# Patient Record
Sex: Female | Born: 1960 | Hispanic: No | Marital: Single | Smoking: Former smoker
Health system: Southern US, Community
[De-identification: ages and names within clinical notes are randomized; demographics above are authoritative.]

## PROBLEM LIST (undated history)

## (undated) DIAGNOSIS — Z9289 Personal history of other medical treatment: Secondary | ICD-10-CM

## (undated) DIAGNOSIS — H409 Unspecified glaucoma: Secondary | ICD-10-CM

## (undated) DIAGNOSIS — E119 Type 2 diabetes mellitus without complications: Secondary | ICD-10-CM

## (undated) DIAGNOSIS — I10 Essential (primary) hypertension: Secondary | ICD-10-CM

## (undated) HISTORY — PX: TUBAL LIGATION: SHX77

## (undated) HISTORY — PX: TONSILLECTOMY AND ADENOIDECTOMY: SUR1326

## (undated) HISTORY — DX: Personal history of other medical treatment: Z92.89

## (undated) HISTORY — DX: Type 2 diabetes mellitus without complications: E11.9

## (undated) HISTORY — DX: Essential (primary) hypertension: I10

## (undated) HISTORY — DX: Unspecified glaucoma: H40.9

## (undated) HISTORY — PX: APPENDECTOMY: SHX54

---

## 2012-02-23 DIAGNOSIS — IMO0001 Reserved for inherently not codable concepts without codable children: Secondary | ICD-10-CM | POA: Insufficient documentation

## 2012-02-23 DIAGNOSIS — H401190 Primary open-angle glaucoma, unspecified eye, stage unspecified: Secondary | ICD-10-CM | POA: Insufficient documentation

## 2012-02-23 DIAGNOSIS — H2510 Age-related nuclear cataract, unspecified eye: Secondary | ICD-10-CM | POA: Insufficient documentation

## 2013-03-11 DIAGNOSIS — I10 Essential (primary) hypertension: Secondary | ICD-10-CM | POA: Insufficient documentation

## 2013-03-11 DIAGNOSIS — H409 Unspecified glaucoma: Secondary | ICD-10-CM | POA: Insufficient documentation

## 2013-03-11 DIAGNOSIS — E119 Type 2 diabetes mellitus without complications: Secondary | ICD-10-CM | POA: Insufficient documentation

## 2013-03-11 DIAGNOSIS — N959 Unspecified menopausal and perimenopausal disorder: Secondary | ICD-10-CM | POA: Insufficient documentation

## 2013-10-29 DIAGNOSIS — M542 Cervicalgia: Secondary | ICD-10-CM

## 2013-10-29 DIAGNOSIS — G8929 Other chronic pain: Secondary | ICD-10-CM | POA: Insufficient documentation

## 2014-08-03 DIAGNOSIS — L700 Acne vulgaris: Secondary | ICD-10-CM | POA: Insufficient documentation

## 2015-03-31 LAB — HM MAMMOGRAPHY

## 2015-10-07 DIAGNOSIS — H401131 Primary open-angle glaucoma, bilateral, mild stage: Secondary | ICD-10-CM | POA: Diagnosis not present

## 2015-11-24 DIAGNOSIS — G8929 Other chronic pain: Secondary | ICD-10-CM | POA: Diagnosis not present

## 2015-11-24 DIAGNOSIS — Z794 Long term (current) use of insulin: Secondary | ICD-10-CM | POA: Diagnosis not present

## 2015-11-24 DIAGNOSIS — I10 Essential (primary) hypertension: Secondary | ICD-10-CM | POA: Diagnosis not present

## 2015-11-24 DIAGNOSIS — E119 Type 2 diabetes mellitus without complications: Secondary | ICD-10-CM | POA: Diagnosis not present

## 2015-11-24 DIAGNOSIS — M542 Cervicalgia: Secondary | ICD-10-CM | POA: Diagnosis not present

## 2015-12-13 DIAGNOSIS — M5010 Cervical disc disorder with radiculopathy, unspecified cervical region: Secondary | ICD-10-CM | POA: Diagnosis not present

## 2015-12-13 DIAGNOSIS — M5412 Radiculopathy, cervical region: Secondary | ICD-10-CM | POA: Diagnosis not present

## 2015-12-13 DIAGNOSIS — M4802 Spinal stenosis, cervical region: Secondary | ICD-10-CM | POA: Diagnosis not present

## 2015-12-13 DIAGNOSIS — I10 Essential (primary) hypertension: Secondary | ICD-10-CM | POA: Diagnosis not present

## 2015-12-13 DIAGNOSIS — M5030 Other cervical disc degeneration, unspecified cervical region: Secondary | ICD-10-CM | POA: Diagnosis not present

## 2015-12-13 DIAGNOSIS — M542 Cervicalgia: Secondary | ICD-10-CM | POA: Diagnosis not present

## 2015-12-13 DIAGNOSIS — E119 Type 2 diabetes mellitus without complications: Secondary | ICD-10-CM | POA: Diagnosis not present

## 2015-12-23 DIAGNOSIS — M5412 Radiculopathy, cervical region: Secondary | ICD-10-CM | POA: Diagnosis not present

## 2015-12-23 DIAGNOSIS — M4802 Spinal stenosis, cervical region: Secondary | ICD-10-CM | POA: Diagnosis not present

## 2016-01-17 DIAGNOSIS — M5412 Radiculopathy, cervical region: Secondary | ICD-10-CM | POA: Insufficient documentation

## 2016-01-27 DIAGNOSIS — M5412 Radiculopathy, cervical region: Secondary | ICD-10-CM | POA: Diagnosis not present

## 2016-02-18 DIAGNOSIS — M4802 Spinal stenosis, cervical region: Secondary | ICD-10-CM | POA: Diagnosis not present

## 2016-02-24 DIAGNOSIS — G8929 Other chronic pain: Secondary | ICD-10-CM | POA: Diagnosis not present

## 2016-02-24 DIAGNOSIS — N959 Unspecified menopausal and perimenopausal disorder: Secondary | ICD-10-CM | POA: Diagnosis not present

## 2016-02-24 DIAGNOSIS — M542 Cervicalgia: Secondary | ICD-10-CM | POA: Diagnosis not present

## 2016-02-24 DIAGNOSIS — I10 Essential (primary) hypertension: Secondary | ICD-10-CM | POA: Diagnosis not present

## 2016-02-24 DIAGNOSIS — E119 Type 2 diabetes mellitus without complications: Secondary | ICD-10-CM | POA: Diagnosis not present

## 2016-04-14 DIAGNOSIS — M5412 Radiculopathy, cervical region: Secondary | ICD-10-CM | POA: Insufficient documentation

## 2016-05-03 DIAGNOSIS — M5412 Radiculopathy, cervical region: Secondary | ICD-10-CM | POA: Diagnosis not present

## 2016-05-08 ENCOUNTER — Ambulatory Visit (INDEPENDENT_AMBULATORY_CARE_PROVIDER_SITE_OTHER): Payer: BLUE CROSS/BLUE SHIELD | Admitting: Osteopathic Medicine

## 2016-05-08 ENCOUNTER — Encounter: Payer: Self-pay | Admitting: Osteopathic Medicine

## 2016-05-08 VITALS — BP 150/87 | HR 72 | Ht 66.0 in | Wt 135.0 lb

## 2016-05-08 DIAGNOSIS — M5412 Radiculopathy, cervical region: Secondary | ICD-10-CM

## 2016-05-08 DIAGNOSIS — H409 Unspecified glaucoma: Secondary | ICD-10-CM

## 2016-05-08 DIAGNOSIS — F17200 Nicotine dependence, unspecified, uncomplicated: Secondary | ICD-10-CM

## 2016-05-08 DIAGNOSIS — M4802 Spinal stenosis, cervical region: Secondary | ICD-10-CM

## 2016-05-08 DIAGNOSIS — E1159 Type 2 diabetes mellitus with other circulatory complications: Secondary | ICD-10-CM

## 2016-05-08 DIAGNOSIS — N959 Unspecified menopausal and perimenopausal disorder: Secondary | ICD-10-CM

## 2016-05-08 DIAGNOSIS — Z794 Long term (current) use of insulin: Secondary | ICD-10-CM | POA: Diagnosis not present

## 2016-05-08 LAB — POCT GLYCOSYLATED HEMOGLOBIN (HGB A1C): HEMOGLOBIN A1C: 7.9

## 2016-05-08 MED ORDER — VARENICLINE TARTRATE 1 MG PO TABS: 1.0000 mg | ORAL_TABLET | Freq: Two times a day (BID) | ORAL | 3 refills | Status: DC

## 2016-05-08 NOTE — Progress Notes (Signed)
HPI: Angel [name] is a 55 y.o. female  who presents to [facility] today, 05/08/16,  for chief complaint of:  Chief Complaint  Patient presents with  . Establish Care     CARDIOVASCULAR Losartan-HCTZ HTN - was a little high today, No chest pain, pressure, shortness of breath.  RESPIRATORY Needs refill Chantix   NEUROLOGICAL/musculoskeletal Hx cervical radiculopathy, following with pain management, undergoing epidural steroid injections. Tramadol, tizanidine, gabapentin or prescribed by pain management specialist.  REPRODUCTIVE Medroxyprogesterone for hormone replacement therapy, patient has not been on estrogen, progesterone has helped hot flashes  ENDOCRINE Type 2 diabetes medications include Onglyza, Lantus  Checking Glc in Am, typically in 140-150  Previously on Invokana but cost on this increased so she discontinued Previously Januvia and Levemir.   HEENT On latanoprost and timolol for glaucoma.      Past medical, surgical, social and family history reviewed: Patient Active Problem List   Diagnosis Date Noted  . Cervical radiculitis 04/14/2016  . Cervical radiculopathy 01/17/2016  . Cervical spinal stenosis 12/13/2015  . Chronic neck pain 10/29/2013  . Diabetes mellitus type 2 in nonobese ([place]) 03/11/2013  . Essential hypertension 03/11/2013  . Glaucoma (increased eye pressure) 03/11/2013  . Menopausal and postmenopausal disorder 03/11/2013   History reviewed. No pertinent surgical history. Social History  Substance Use Topics  . Smoking status: Never Smoker  . Smokeless tobacco: Never Used  . Alcohol use Not on file   History reviewed. No pertinent family history.   Current medication list and allergy/intolerance information reviewed:   Current Outpatient Prescriptions  Medication Sig Dispense Refill  . Blood Glucose Monitoring Suppl ([place]) w/Device KIT     . gabapentin (NEURONTIN) 300 MG capsule Take 1  tab qhs for 5 days, increase to BID for 5 days, then increase to TID.    Marland Kitchen Insulin Glargine (LANTUS SOLOSTAR) 100 UNIT/ML Solostar Pen inject 10 units NIGHTLY    . latanoprost (XALATAN) 0.005 % ophthalmic solution     . losartan-hydrochlorothiazide (HYZAAR) 100-12.5 MG tablet take 1 tablet by mouth once daily    . medroxyPROGESTERone (PROVERA) 10 MG tablet take 1 tablet by mouth once daily    . saxagliptin HCl (ONGLYZA) 5 MG TABS tablet take 1 tablet by mouth once daily    . timolol (TIMOPTIC) 0.5 % ophthalmic solution 0.5 mLs.    Marland Kitchen tiZANidine (ZANAFLEX) 4 MG tablet take 1 to 2 tablets by mouth every evening if needed for SPASM    . traMADol (ULTRAM) 50 MG tablet take 1 tablet by mouth twice a day    . aspirin 81 MG chewable tablet Chew 81 mg by mouth.    . medroxyPROGESTERone (PROVERA) 5 MG tablet   0   No current facility-administered medications for this visit.    No Known Allergies    Review of Systems:  Constitutional:  No  fever, no chills, No recent illness, No unintentional weight changes. No significant fatigue.   HEENT: No  headache, no vision change, no hearing change, No sore throat, No  sinus pressure  Cardiac: No  chest pain, No  pressure, No palpitations, No  Orthopnea  Respiratory:  No  shortness of breath. No  Cough  Gastrointestinal: No  abdominal pain, No  nausea, No  vomiting,  No  blood in stool, No  diarrhea, No  constipation   Musculoskeletal: No new myalgia/arthralgia  Genitourinary: No  incontinence, No  abnormal genital bleeding, No abnormal genital discharge  Skin: No  Rash, No other wounds/concerning lesions  Hem/Onc: No  easy bruising/bleeding, No  abnormal lymph node  Endocrine: No cold intolerance,  No heat intolerance. No polyuria/polydipsia/polyphagia   Neurologic: No  weakness, No  dizziness, No  slurred speech/focal weakness/facial droop  Psychiatric: No  concerns with depression, No  concerns with anxiety, No sleep problems, No mood  problems  Exam:  BP (!) 150/87   Pulse 72   Ht 5' 6"  (1.676 m)   Wt 135 lb (61.2 kg)   BMI 21.79 kg/m   Constitutional: VS see above. General Appearance: alert, well-developed, well-nourished, NAD  Eyes: Normal lids and conjunctive, non-icteric sclera  Ears, Nose, Mouth, Throat: MMM, Normal external inspection ears/nares/mouth/lips/gums.   Neck: No masses, trachea midline. No thyroid enlargement. No tenderness/mass appreciated. No lymphadenopathy  Respiratory: Normal respiratory effort. no wheeze, no rhonchi, no rales  Cardiovascular: S1/S2 normal, no murmur, no rub/gallop auscultated. RRR. No lower extremity edema.    Musculoskeletal: Gait normal. No clubbing/cyanosis of digits.   Neurological: Normal balance/coordination. No tremor. .   Skin: warm, dry, intact. No rash/ulcer. No concerning nevi or subq nodules on limited exam.    Psychiatric: Normal judgment/insight. Normal mood and affect. Oriented x3.    Results for orders placed or performed in visit on 05/08/16 (from the past 72 hour(s))  POCT HgB A1C     Status: None   Collection Time: 05/08/16  3:09 PM  Result Value Ref Range   Hemoglobin A1C 7.9     No results found.  Recent labs reviewed: Last A1c was 8.5 on 02/24/2016. Also on the blood draw, no concerns on liver or kidney function, LDL less than 100.  ASSESSMENT/PLAN:   Type 2 diabetes mellitus with other circulatory complication, with long-term current use of insulin ([facility]CC) - Continue current medications, A1c indicates good control. - Plan: POCT HgB A1C  Cervical radiculopathy - Following with pain management, patient has some other musculoskeletal complaints which were not addressed today, sports medicine recommendation given  Cervical spinal stenosis  Glaucoma of both eyes, unspecified glaucoma type - Following with ophthalmology  Menopausal and postmenopausal disorder - Unusual hormone replacement therapy with just progesterone but patient seems to  be tolerating this well. Option to start estrogen, discuss further at next visit  Tobacco dependence - Refilled Chantix, patient has been on this medication before, declines initial starter pack - Plan: varenicline (CHANTIX CONTINUING MONTH PAK) 1 MG tablet    Patient Instructions  Dr. Georgina Snell, Dr. Dianah Field - sports medicine     Visit summary with medication list and pertinent instructions was printed for patient to review. All questions at time of visit were answered - patient instructed to contact office with any additional concerns. ER/RTC precautions were reviewed with the patient. Follow-up plan: Return for ANNUAL PHYSICAL in 4-6 months, sooner if needed.  Note: Total time spent 45 minutes, greater than 50% of the visit was spent face-to-face counseling and coordinating care for the following: The primary encounter diagnosis was Type 2 diabetes mellitus with other circulatory complication, with long-term current use of insulin ([place]). Diagnoses of Cervical radiculopathy, Cervical spinal stenosis, Glaucoma of both eyes, unspecified glaucoma type, Menopausal and postmenopausal disorder, and Tobacco dependence were also pertinent to this visit.Marland Kitchen

## 2016-05-08 NOTE — Patient Instructions (Signed)
Dr. Georgina Snell, Dr. Dianah Field - sports medicine

## 2016-05-23 ENCOUNTER — Ambulatory Visit (INDEPENDENT_AMBULATORY_CARE_PROVIDER_SITE_OTHER): Payer: BLUE CROSS/BLUE SHIELD | Admitting: Sports Medicine

## 2016-05-23 ENCOUNTER — Ambulatory Visit (INDEPENDENT_AMBULATORY_CARE_PROVIDER_SITE_OTHER): Payer: BLUE CROSS/BLUE SHIELD

## 2016-05-23 ENCOUNTER — Encounter: Payer: Self-pay | Admitting: Sports Medicine

## 2016-05-23 DIAGNOSIS — M19012 Primary osteoarthritis, left shoulder: Secondary | ICD-10-CM | POA: Diagnosis not present

## 2016-05-23 DIAGNOSIS — M25512 Pain in left shoulder: Secondary | ICD-10-CM

## 2016-05-23 DIAGNOSIS — G8929 Other chronic pain: Secondary | ICD-10-CM | POA: Diagnosis not present

## 2016-05-23 NOTE — Progress Notes (Addendum)
   Subjective:    I'm seeing this patient as a consultation for:  Dr. Emeterio Reeve  CC: Left shoulder pain  HPI: This is a pleasant 55 year old female, she has a long history of neck pain, with an MRI showing multilevel cervical spinal stenosis, she's done well with regards to her axial neck pain with cervical epidurals. For many months now she's also had pain that she localizes over the posterior glenohumeral joint on the left with radiation over the back of the arm but not past the elbow. Her range of motion is severely limited, and her pain is worsened with abduction and external rotation. She tells me that the arm feels dead in terms of strength but not sensation.  Past medical history:  Negative.  See flowsheet/record as well for more information.  Surgical history: Negative.  See flowsheet/record as well for more information.  Family history: Negative.  See flowsheet/record as well for more information.  Social history: Negative.  See flowsheet/record as well for more information.  Allergies, and medications have been entered into the medical record, reviewed, and no changes needed.   Review of Systems: No headache, visual changes, nausea, vomiting, diarrhea, constipation, dizziness, abdominal pain, skin rash, fevers, chills, night sweats, weight loss, swollen lymph nodes, body aches, joint swelling, muscle aches, chest pain, shortness of breath, mood changes, visual or auditory hallucinations.   Objective:   General: Well Developed, well nourished, and in no acute distress.  Neuro/Psych: Alert and oriented x3, extra-ocular muscles intact, able to move all 4 extremities, sensation grossly intact. Skin: Warm and dry, no rashes noted.  Respiratory: Not using accessory muscles, speaking in full sentences, trachea midline.  Cardiovascular: Pulses palpable, no extremity edema. Abdomen: Does not appear distended. Left Shoulder: Inspection reveals no abnormalities, atrophy or  asymmetry. Palpation is normal with no tenderness over AC joint or bicipital groove. Range of motion is limited in all planes with approximately 20 of abduction, and 40 of external rotation, significantly less compared to the contralateral side. Rotator cuff strength normal throughout. Positive Neer and Hawkin's tests, empty can. Speeds and Yergason's tests normal. No labral pathology noted with negative Obrien's, negative crank, negative clunk, and good stability. Normal scapular function observed. No painful arc and no drop arm sign. No apprehension sign.  Procedure: Real-time Ultrasound Guided Injection of left glenohumeral joint Device: GE Logiq E  Verbal informed consent obtained.  Time-out conducted.  Noted no overlying erythema, induration, or other signs of local infection.  Skin prepped in a sterile fashion.  Local anesthesia: Topical Ethyl chloride.  With sterile technique and under real time ultrasound guidance:  22-gauge spinal needle advanced into the joint from a posterior approach and 1 mL Kenalog 40, 2 mL lidocaine, 2 mL Marcaine injected easily. Completed without difficulty  Pain immediately resolved suggesting accurate placement of the medication.  Advised to call if fevers/chills, erythema, induration, drainage, or persistent bleeding.  Images permanently stored and available for review in the ultrasound unit.  Impression: Technically successful ultrasound guided injection.  Impression and Recommendations:   This case required medical decision making of moderate complexity.  Left shoulder pain Exam is consistent today with adhesive capsulitis. X-rays, glenohumeral injection under ultrasound guidance, formal physical therapy.  Return in 6 weeks, MRI if no better. 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

## 2016-05-23 NOTE — Assessment & Plan Note (Signed)
Exam is consistent today with adhesive capsulitis. X-rays, glenohumeral injection under ultrasound guidance, formal physical therapy.  Return in 6 weeks, MRI if no better. 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

## 2016-06-01 ENCOUNTER — Ambulatory Visit (INDEPENDENT_AMBULATORY_CARE_PROVIDER_SITE_OTHER): Payer: BLUE CROSS/BLUE SHIELD | Admitting: Rehabilitative and Restorative Service Providers"

## 2016-06-01 DIAGNOSIS — R293 Abnormal posture: Secondary | ICD-10-CM | POA: Diagnosis not present

## 2016-06-01 DIAGNOSIS — R29898 Other symptoms and signs involving the musculoskeletal system: Secondary | ICD-10-CM | POA: Diagnosis not present

## 2016-06-01 DIAGNOSIS — M25512 Pain in left shoulder: Secondary | ICD-10-CM

## 2016-06-01 DIAGNOSIS — G8929 Other chronic pain: Secondary | ICD-10-CM | POA: Diagnosis not present

## 2016-06-01 NOTE — Patient Instructions (Addendum)
Shoulder Blade Squeeze    Rotate shoulders back, then squeeze shoulder blades down and back. Hold 10 sec Repeat __10__ times. Do __several __ sessions per day. Can use swim noodle to cue mid back muscles   Scapula Adduction With Pectoralis Stretch: Low - Standing   Shoulders at 45 hands even with shoulders, keeping weight through legs, shift weight forward until you feel pull or stretch through the front of your chest. Hold _30__ seconds. Do _3__ times, _2-4__ times per day.   Scapula Adduction With Pectoralis Stretch: Mid-Range - Standing   Shoulders at 90 elbows even with shoulders, keeping weight through legs, shift weight forward until you feel pull or strength through the front of your chest. Hold __30_ seconds. Do _3__ times, __2-4_ times per day.   Scapula Adduction With Pectoralis Stretch: High - Standing   Shoulders at 120 hands up high on the doorway, keeping weight on feet, shift weight forward until you feel pull or stretch through the front of your chest. Hold _30__ seconds. Do _3__ times, _2-3__ times per day.  Axial Extension (Chin Tuck)    Pull chin in and lengthen back of neck. Hold __10-15__ seconds while counting out loud. Repeat __5__ times. Do _2-3___ sessions per day.   AROM: Lateral Neck Flexion    Slowly tilt head toward one shoulder, then the other. Hold each position __10-15__ seconds. Repeat __3-5__ times per set. Do _2-3___ sessions per day.  TENS UNIT: This is helpful for muscle pain and spasm.   Search and Purchase a TENS 7000 2nd edition at www.tenspros.com. It should be less than $30.     TENS unit instructions: Do not shower or bathe with the unit on Turn the unit off before removing electrodes or batteries If the electrodes lose stickiness add a drop of water to the electrodes after they are disconnected from the unit and place on plastic sheet. If you continued to have difficulty, call the TENS unit company to purchase more  electrodes. Do not apply lotion on the skin area prior to use. Make sure the skin is clean and dry as this will help prolong the life of the electrodes. After use, always check skin for unusual red areas, rash or other skin difficulties. If there are any skin problems, does not apply electrodes to the same area. Never remove the electrodes from the unit by pulling the wires. Do not use the TENS unit or electrodes other than as directed. Do not change electrode placement without consultating your therapist or physician. Keep 2 fingers with between each electrode.

## 2016-06-01 NOTE — Therapy (Signed)
[facility] [place], [place], [address] Phone: [phone]   Fax:  [phone]  Physical Therapy Evaluation  Patient Details  Name: Angel [name] MRN: [id number] Date of Birth: [birth year] Referring Provider: Dr Dianah Field  Encounter Date: 06/01/2016      PT End of Session - 06/01/16 0929    Visit Number 1   Number of Visits 12   Date for PT Re-Evaluation 07/13/16   PT Start Time 0929   PT Stop Time 1028   PT Time Calculation (min) 59 min   Activity Tolerance Patient tolerated treatment well      Past Medical History:  Diagnosis Date  . Diabetes mellitus without complication ([place])   . Glaucoma   . History of bone density study   . Hypertension     Past Surgical History:  Procedure Laterality Date  . APPENDECTOMY    . TONSILLECTOMY AND ADENOIDECTOMY    . TUBAL LIGATION      There were no vitals filed for this visit.       Subjective Assessment - 06/01/16 0935    Subjective Patient reports 5 year history of upper back/neck pain which has improved with 3 spinal epidural injections in upper back and neck. She continued pain in the Lt shoulder which has improved following injection from Dr Dianah Field last week.    Pertinent History AODM; HTM; glucoma   Diagnostic tests xrays   Patient Stated Goals get rid of pain and use arm normally again    Currently in Pain? 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 1   Has the patient had a decrease in activity level because of a fear of falling?  No   Is the patient reluctant to leave their home because of a fear of falling?  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; reaching; pulling; lifting    Leisure household chores; has a Primary school teacher at home - uses stepper      Observation/Other Assessments   Focus on Therapeutic Outcomes (FOTO)  47% limitation      Sensation   Additional Comments WNL's per pt report      Posture/Postural Control   Posture Comments head forward; shoulders rounded  and elevated; scapulae abducted and rotated along the thoracic wall      AROM   Right Shoulder Extension 52 Degrees   Right Shoulder Flexion 146 Degrees   Right Shoulder ABduction 165 Degrees   Right Shoulder Internal Rotation 32 Degrees   Right Shoulder External Rotation 98 Degrees   Left Shoulder Extension 39 Degrees   Left Shoulder Flexion 122 Degrees  discomfort   Left Shoulder ABduction 127 Degrees   Left Shoulder Internal Rotation 27 Degrees  compensatory mvt patterns    Left Shoulder External Rotation 90 Degrees  compensatory movement patterns    Cervical Flexion 50   Cervical Extension 63   Cervical - Right Side Bend 30   Cervical - Left Side Bend 35   Cervical - Right Rotation 62   Cervical - Left Rotation 65     Strength   Overall Strength Comments 5/5 bilat UE's      Palpation   Palpation comment signifacant muscle tightness through  pecs; upper trap; leveator; teres; cervical paraspinals                    [facility] Adult PT Treatment/Exercise - 06/01/16 0001      Therapeutic Activites    Therapeutic Activities --  myofacial bal release work      Neuro Re-ed    Neuro Re-ed Details  postural correction; education      Shoulder Exercises: Standing   Other Standing Exercises scap squeeze with noodle 10 sec x 10 - required tactile and verbal cues    Other Standing Exercises axail extension 10 sec x 5; lateral cervical flexion 10 sec x 2      Shoulder Exercises: Stretch   Other Shoulder Stretches 3 way doorway 30 sec x 3 reps each position    Other Shoulder Stretches supine prolonged snow angel      Moist Heat Therapy   Number Minutes Moist Heat 20 Minutes   Moist Heat Location Cervical;Shoulder  Lt shd/pecs     Electrical Stimulation   Electrical Stimulation Location Lt posterior cervical; Lt Pensions consultant Action IFC   Electrical Stimulation Parameters to toleracne   Electrical Stimulation Goals Pain;Tone                PT Education - 06/01/16 1007    Education provided Yes   Education Details HEP   Person(s) Educated Patient   Methods Explanation;Demonstration;Tactile cues;Verbal cues;Handout   Comprehension Verbalized understanding;Returned demonstration;Verbal cues required;Tactile cues required             PT Long Term Goals - 06/01/16 0930      PT LONG TERM GOAL #1   Title Inmprove posture and alignmnet with patient to demo good posterior shoudler girdle contraction to improve mechanics and function 07/13/16   Time 6   Period Weeks   Status New     PT LONG TERM GOAL #2   Title Increase Lt shoulder ROM to equal or greater than Rt shoulder 07/13/16   Time 6   Period Weeks   Status New     PT LONG TERM GOAL #3   Title Patient reports increased endurance for wark activities with decreased pain by 50-75% 07/13/16   Time 6   Period Weeks   Status New      PT LONG TERM GOAL #4   Title Independent in HEP 07/13/16   Time 6   Period Weeks   Status New     PT  LONG TERM GOAL #5   Title Improve FOTO to </= 34% limitation 07/13/16   Time 6   Period Weeks   Status New               Plan - 06/01/16 1025    Clinical Impression Statement Patient presents with cervical and thoracic pain and dysfunction which is resolving with injections. She has Lt shoudler pain which is also improved with injection Lt GH joint last week. Patient continues to have poor posture and alignment; limited cervical and Lt > Rt shoulder ROM; muscular tightness and tenderness with palpation; limited functional activity level due to myofacial dysfunction.   Rehab Potential Good   PT Frequency 2x / week   PT Duration 6 weeks   PT Treatment/Interventions Patient/family education;ADLs/Self Care Home Management;Cryotherapy;Electrical Stimulation;Iontophoresis 4mg /ml Dexamethasone;Moist Heat;Traction;Ultrasound;Dry needling;Manual techniques;Therapeutic activities;Therapeutic exercise   PT Next Visit Plan focus on Lt shd ROM/mobility inc pulley/joint mobs/PROM; manual work through [employer] upper quarter - significant tightness through [place]; neuromuscular reeducation for posture and alignment; modalities as indicated    Consulted and Agree with Plan of Care Patient      Patient will benefit from skilled therapeutic intervention in order to improve the following deficits and impairments:  Postural dysfunction, Improper body mechanics, Pain, Impaired UE functional use, Increased fascial restricitons, Increased muscle spasms, Decreased range of motion, Decreased mobility, Decreased activity tolerance  Visit Diagnosis: Chronic left shoulder pain - Plan: PT plan of care cert/re-cert  Other symptoms and signs involving the musculoskeletal system - Plan: PT plan of care cert/re-cert  Abnormal posture - Plan: PT plan of care cert/re-cert     Problem List Patient Active  Problem List   Diagnosis Date Noted  . Left shoulder pain 05/23/2016  . Cervical spinal stenosis 12/13/2015  . Diabetes mellitus type 2 in nonobese ([place]) 03/11/2013  . Essential hypertension 03/11/2013  . Glaucoma (increased eye pressure) 03/11/2013  . Menopausal and postmenopausal disorder 03/11/2013    Adrien Dietzman Nilda Simmer PT, MPH  06/01/2016, 10:40 AM  [facility] [place], [place], [address] Phone: [phone]   Fax:  [phone]  Name: Angel [name] MRN: [id number] Date of Birth: 1960/08/01

## 2016-06-02 DIAGNOSIS — M5412 Radiculopathy, cervical region: Secondary | ICD-10-CM | POA: Diagnosis not present

## 2016-06-05 ENCOUNTER — Ambulatory Visit (INDEPENDENT_AMBULATORY_CARE_PROVIDER_SITE_OTHER): Payer: BLUE CROSS/BLUE SHIELD | Admitting: Physical Therapy

## 2016-06-05 ENCOUNTER — Encounter (INDEPENDENT_AMBULATORY_CARE_PROVIDER_SITE_OTHER): Payer: Self-pay

## 2016-06-05 DIAGNOSIS — R293 Abnormal posture: Secondary | ICD-10-CM

## 2016-06-05 DIAGNOSIS — M25512 Pain in left shoulder: Secondary | ICD-10-CM

## 2016-06-05 DIAGNOSIS — G8929 Other chronic pain: Secondary | ICD-10-CM

## 2016-06-05 DIAGNOSIS — R29898 Other symptoms and signs involving the musculoskeletal system: Secondary | ICD-10-CM | POA: Diagnosis not present

## 2016-06-05 NOTE — Therapy (Signed)
[place], [place], [address] Phone: [phone]   Fax:  [phone]  Physical Therapy Treatment  Patient Details  Name: Angel [name] MRN: [id number] Date of Birth: 08/09/1960 Referring Provider: Dr. Helane Rima  Encounter Date: 06/05/2016      PT End of Session - 06/05/16 0717    Visit Number 2   Number of Visits 12   Date for PT Re-Evaluation 07/13/16   PT Start Time 0715   PT Stop Time 0801   PT Time Calculation (min) 46 min   Activity Tolerance Patient tolerated treatment well   Behavior During Therapy [facility] for tasks assessed/performed      Past Medical History:  Diagnosis Date  . Diabetes mellitus without complication ([place])   . Glaucoma   . History of bone density study   . Hypertension     Past Surgical History:  Procedure Laterality Date  . APPENDECTOMY    . TONSILLECTOMY AND ADENOIDECTOMY    . TUBAL LIGATION      There were no vitals filed for this visit.      Subjective Assessment - 06/05/16 0718    Subjective Pt reports she has been doing exercises Dr. Helane Rima issued as well as the ones Gillermo Murdoch, PT gave last session.  "I'm feeling great".   Pt reports she is 90% better than when she visited the MD.    Patient Stated Goals get rid of pain and use arm normally again    Currently in Pain? Yes   Pain Score 1    Pain Location Shoulder   Pain Orientation Left   Pain Descriptors / Indicators Dull;Aching   Aggravating Factors  shaking 30-40# at work   Pain Relieving Factors injection, ice/heat             [facility] PT Assessment - 06/05/16 0001      Assessment   Medical Diagnosis Lt shoulder pain - adhesive capsulitis   Referring Provider Dr. Helane Rima   Onset Date/Surgical Date 01/11/16   Hand Dominance Right   Next MD Visit 2/18   Prior Therapy none for shoulder           [facility] Adult PT Treatment/Exercise - 06/05/16 0001      Shoulder Exercises:  Standing   Internal Rotation AAROM;Left;10 reps  with cane   Flexion AAROM;Left;5 reps  with cane   ABduction AAROM;Left;10 reps  with cane    Extension AAROM;Left;5 reps  with cane    Other Standing Exercises scap squeeze x 10 sec x 5 reps with tactile and visual cues with mirror   Other Standing Exercises axial extension 10 sec x 5- tactile cues and mirror for visual feedback. lateral cervical flexion stretch 10 sec x 3 reps each side.      Shoulder Exercises: Pulleys   Flexion --  10 reps, held 10 sec   ABduction --  10 reps, 10 sec hold     Shoulder Exercises: Stretch   Internal Rotation Stretch --  10 reps, 10 sec (RUE assisting LUE)   Other Shoulder Stretches 3 way doorway 30 sec x  1 rep each position, trying modified position for decreased shoulder and low back discomfort.    Other Shoulder Stretches on 1/2 foam roll:  snow angels to tolerance, x 15 reps      Modalities   Modalities --  pt declined.  will do at work/home.      Manual Therapy   Manual Therapy Soft tissue  mobilization;Myofascial release;Passive ROM   Manual therapy comments pt in supported hooklying    Soft tissue mobilization TPR to Lt infraspinatus/ subscap.    Myofascial Release Lt pec release     Passive ROM Lt shoulder flexion, abd, ER           PT Long Term Goals - 06/05/16 0817      PT LONG TERM GOAL #1   Title Improve posture and alignmnet with patient to demo good posterior shoulder girdle contraction to improve mechanics and function 07/13/16   Time 6   Period Weeks   Status On-going     PT LONG TERM GOAL #2   Title Increase Lt shoulder ROM to equal or greater than Rt shoulder 07/13/16   Time 6   Period Weeks   Status On-going     PT LONG TERM GOAL #3   Title Patient reports increased endurance for work activities with decreased pain by 50-75% 07/13/16   Time 6   Period Weeks   Status On-going     PT LONG TERM GOAL #4   Title Independent in HEP 07/13/16   Period Weeks   Status  On-going     PT LONG TERM GOAL #5   Title Improve FOTO to </= 34% limitation 07/13/16   Time 6   Period Weeks   Status On-going               Plan - 06/05/16 0813    Clinical Impression Statement Pt reporting improved mobility in Lt shoulder since injection.  She required multiple cues for correcting form on existing exercises and to reduce pain when performing them.  She was point tender in Lt rotator cuff and pec with manual therapy.  Pt is progressing towards goals.  Pt voiced interest in reduction of frequency of appts in future.     Rehab Potential Good   PT Frequency 2x / week   PT Duration 6 weeks   PT Treatment/Interventions Patient/family education;ADLs/Self Care Home Management;Cryotherapy;Electrical Stimulation;Iontophoresis 4mg /ml Dexamethasone;Moist Heat;Traction;Ultrasound;Dry needling;Manual techniques;Therapeutic activities;Therapeutic exercise   PT Next Visit Plan Manual therapy through Lt upper quarter. Progress HEP as tolerated.    Consulted and Agree with Plan of Care Patient      Patient will benefit from skilled therapeutic intervention in order to improve the following deficits and impairments:  Postural dysfunction, Improper body mechanics, Pain, Impaired UE functional use, Increased fascial restricitons, Increased muscle spasms, Decreased range of motion, Decreased mobility, Decreased activity tolerance  Visit Diagnosis: Chronic left shoulder pain  Other symptoms and signs involving the musculoskeletal system  Abnormal posture     Problem List Patient Active Problem List   Diagnosis Date Noted  . Left shoulder pain 05/23/2016  . Cervical spinal stenosis 12/13/2015  . Diabetes mellitus type 2 in nonobese ([place]) 03/11/2013  . Essential hypertension 03/11/2013  . Glaucoma (increased eye pressure) 03/11/2013  . Menopausal and postmenopausal disorder 03/11/2013   Kerin Perna, PTA 06/05/16 8:18 AM  [facility] [place], [place], [address] Phone: [phone]   Fax:  [phone]  Name: Angel [name] MRN: [id number] Date of Birth: [birth year]

## 2016-06-07 ENCOUNTER — Encounter: Payer: Self-pay | Admitting: Rehabilitative and Restorative Service Providers"

## 2016-06-07 ENCOUNTER — Ambulatory Visit (INDEPENDENT_AMBULATORY_CARE_PROVIDER_SITE_OTHER): Payer: BLUE CROSS/BLUE SHIELD | Admitting: Rehabilitative and Restorative Service Providers"

## 2016-06-07 DIAGNOSIS — M25512 Pain in left shoulder: Secondary | ICD-10-CM

## 2016-06-07 DIAGNOSIS — R293 Abnormal posture: Secondary | ICD-10-CM

## 2016-06-07 DIAGNOSIS — R29898 Other symptoms and signs involving the musculoskeletal system: Secondary | ICD-10-CM

## 2016-06-07 DIAGNOSIS — G8929 Other chronic pain: Secondary | ICD-10-CM | POA: Diagnosis not present

## 2016-06-07 NOTE — Therapy (Signed)
[place], [place], [address] Phone: [phone]   Fax:  [phone]  Physical Therapy Treatment  Patient Details  Name: Angel [name] MRN: [id number] Date of Birth: 1960/05/31 Referring Provider: Dr. Helane Rima  Encounter Date: 06/07/2016      PT End of Session - 06/07/16 0711    Visit Number 3   Number of Visits 12   Date for PT Re-Evaluation 07/13/16   PT Start Time 0711   PT Stop Time 0800   PT Time Calculation (min) 49 min   Activity Tolerance Patient tolerated treatment well      Past Medical History:  Diagnosis Date  . Diabetes mellitus without complication ([place])   . Glaucoma   . History of bone density study   . Hypertension     Past Surgical History:  Procedure Laterality Date  . APPENDECTOMY    . TONSILLECTOMY AND ADENOIDECTOMY    . TUBAL LIGATION      There were no vitals filed for this visit.      Subjective Assessment - 06/07/16 [id number]    Subjective Patient reports that her shoulder is feeling good. She is working on the exercises at home. Pleased with progress. Feels confident in continuing HEP    Currently in Pain? No/denies                         [facility] Adult PT Treatment/Exercise - 06/07/16 0001      Shoulder Exercises: Supine   Other Supine Exercises snow angel prolonged stretch 2-3 min      Shoulder Exercises: Standing   Extension Strengthening;Both;20 reps   Theraband Level (Shoulder Extension) Level 2 (Red)   Row Strengthening;Both;20 reps   Theraband Level (Shoulder Row) Level 2 (Red)   Retraction Strengthening;Both;20 reps;Theraband   Theraband Level (Shoulder Retraction) Level 2 (Red);Level 1 (Yellow)  10 reps - discomfort Lt arm switched to yellow TB    Other Standing Exercises scap squeeze x 10 sec x 10   Other Standing Exercises axial extension 10 sec x 5     Shoulder Exercises: Therapy Ball   Flexion 5 reps  10 sec hold  stretching into flexion      Shoulder Exercises: ROM/Strengthening   UBE (Upper Arm Bike) L3 x 4 min alt fwd/back      Iontophoresis   Type of Iontophoresis Dexamethasone   Location ant Lt shd   Dose 18m/amp   Time 8 min      Manual Therapy   Manual therapy comments pt in supported hooklying    Soft tissue mobilization Lt pecs/ant shd/teres   Myofascial Release Lt pec   Passive ROM Lt shoulder flexion, abd, ER, IR                 PT Education - 06/07/16 0749    Education provided Yes   Education Details HEP   Person(s) Educated Patient   Methods Explanation;Demonstration;Tactile cues;Verbal cues;Handout   Comprehension Verbalized understanding;Returned demonstration;Verbal cues required;Tactile cues required             PT Long Term Goals - 06/05/16 0817      PT LONG TERM GOAL #1   Title Improve posture and alignmnet with patient to demo good posterior shoulder girdle contraction to improve mechanics and function 07/13/16   Time 6   Period Weeks   Status On-going     PT LONG TERM GOAL #2   Title Increase Lt  shoulder ROM to equal or greater than Rt shoulder 07/13/16   Time 6   Period Weeks   Status On-going     PT LONG TERM GOAL #3   Title Patient reports increased endurance for work activities with decreased pain by 50-75% 07/13/16   Time 6   Period Weeks   Status On-going     PT LONG TERM GOAL #4   Title Independent in HEP 07/13/16   Period Weeks   Status On-going     PT LONG TERM GOAL #5   Title Improve FOTO to </= 34% limitation 07/13/16   Time 6   Period Weeks   Status On-going               Plan - 06/07/16 0803    Clinical Impression Statement Improving mobility and ROM noted with manual work. Continued end range tightness Lt shoulder and tightness through the pecs/shd girdle musculature. Some pain or discomfort with some of exercises - modified to decresae tolerance. Trial of ionto. Contiinued progress.   Rehab Potential Good   PT  Frequency 2x / week   PT Duration 6 weeks   PT Treatment/Interventions Patient/family education;ADLs/Self Care Home Management;Cryotherapy;Electrical Stimulation;Iontophoresis 4mg /ml Dexamethasone;Moist Heat;Traction;Ultrasound;Dry needling;Manual techniques;Therapeutic activities;Therapeutic exercise   PT Next Visit Plan Manual therapy through Lt upper quarter. Progress HEP as tolerated.    Consulted and Agree with Plan of Care Patient      Patient will benefit from skilled therapeutic intervention in order to improve the following deficits and impairments:  Postural dysfunction, Improper body mechanics, Pain, Impaired UE functional use, Increased fascial restricitons, Increased muscle spasms, Decreased range of motion, Decreased mobility, Decreased activity tolerance  Visit Diagnosis: Chronic left shoulder pain  Other symptoms and signs involving the musculoskeletal system  Abnormal posture     Problem List Patient Active Problem List   Diagnosis Date Noted  . Left shoulder pain 05/23/2016  . Cervical spinal stenosis 12/13/2015  . Diabetes mellitus type 2 in nonobese ([place]) 03/11/2013  . Essential hypertension 03/11/2013  . Glaucoma (increased eye pressure) 03/11/2013  . Menopausal and postmenopausal disorder 03/11/2013    Sarvesh Meddaugh Nilda Simmer PT, MPH  06/07/2016, 8:04 AM  [facility] [place], [place], [address] Phone: [phone]   Fax:  [phone]  Name: Angel [name] MRN: [id number] Date of Birth: [birth year]

## 2016-06-07 NOTE — Patient Instructions (Addendum)
Resisted External Rotation: in Neutral - Bilateral   PALMS UP!! Sit or stand, tubing in both hands, elbows at sides, bent to 90, forearms forward. Pinch shoulder blades together and rotate forearms out. Keep elbows at sides. Repeat __10__ times per set. Do _2-3___ sets per session. Do _2-3___ sessions per day.  Low Row: Standing   Face anchor, feet shoulder width apart. Palms up, pull arms back, squeezing shoulder blades together. Repeat 10__ times per set. Do 2-3__ sets per session. Do 2-3__ sessions per week. Anchor Height: Waist   Strengthening: Resisted Extension   Hold tubing in right hand, arm forward. Pull arm back, elbow straight. Repeat _10___ times per set. Do 2-3____ sets per session. Do 2-3____ sessions per day.  SUPINE Tips A    Being in the supine position means to be lying on the back. Lying on the back is the position of least compression on the bones and discs of the spine, and helps to re-align the natural curves of the back. Arms at side working toward 120 degrees. Hold 3-5 min. Can bend elbows for a few seconds to release pull then return to straight arms

## 2016-06-12 ENCOUNTER — Encounter: Payer: Self-pay | Admitting: Physical Therapy

## 2016-06-14 ENCOUNTER — Encounter: Payer: Self-pay | Admitting: Physical Therapy

## 2016-06-19 ENCOUNTER — Encounter: Payer: Self-pay | Admitting: Physical Therapy

## 2016-06-21 ENCOUNTER — Ambulatory Visit (INDEPENDENT_AMBULATORY_CARE_PROVIDER_SITE_OTHER): Payer: BLUE CROSS/BLUE SHIELD | Admitting: Physical Therapy

## 2016-06-21 DIAGNOSIS — M25512 Pain in left shoulder: Secondary | ICD-10-CM

## 2016-06-21 DIAGNOSIS — R293 Abnormal posture: Secondary | ICD-10-CM

## 2016-06-21 DIAGNOSIS — R29898 Other symptoms and signs involving the musculoskeletal system: Secondary | ICD-10-CM | POA: Diagnosis not present

## 2016-06-21 DIAGNOSIS — G8929 Other chronic pain: Secondary | ICD-10-CM

## 2016-06-21 NOTE — Therapy (Signed)
[place], [place], [address] Phone: [phone]   Fax:  [phone]  Physical Therapy Treatment  Patient Details  Name: Angel [name] MRN: [id number] Date of Birth: [birth year] Referring Provider: Dr. Helane Rima  Encounter Date: 06/21/2016      PT End of Session - 06/21/16 0715    Visit Number 4   Number of Visits 12   Date for PT Re-Evaluation 07/13/16   PT Start Time 0715   PT Stop Time 0756   PT Time Calculation (min) 41 min      Past Medical History:  Diagnosis Date  . Diabetes mellitus without complication ([place])   . Glaucoma   . History of bone density study   . Hypertension     Past Surgical History:  Procedure Laterality Date  . APPENDECTOMY    . TONSILLECTOMY AND ADENOIDECTOMY    . TUBAL LIGATION      There were no vitals filed for this visit.      Subjective Assessment - 06/21/16 0720    Subjective Pt reports her neck is stiff.  She has been doing our HEP as well as Dr. Landry Corporal exercises 2x/day.  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  She didn't notice much difference in shoulder after ionto patch.  No skin irritation.     Patient Stated Goals get rid of pain and use arm normally again    Currently in Pain? Yes   Pain Score 4    Pain Location Shoulder   Pain Orientation Left   Pain Descriptors / Indicators Dull;Sore   Aggravating Factors  shaking 40# samples at work   Pain Relieving Factors ice/heat/TENS            [facility] PT Assessment - 06/21/16 0001      AROM   Right Shoulder Extension 51 Degrees   Right Shoulder Flexion 157 Degrees   Right Shoulder ABduction 163 Degrees   Right Shoulder Internal Rotation 50 Degrees   Right Shoulder External Rotation 90 Degrees   Left Shoulder Extension 45 Degrees   Left Shoulder Flexion 142 Degrees   Left Shoulder ABduction 144 Degrees   Left Shoulder Internal Rotation 45 Degrees  supine abdct  60 deg.    Left Shoulder External Rotation 84 Degrees  supine, abdct 90 deg, discomfort at end   Cervical - Right Side Bend 48   Cervical - Left Side Bend 45   Cervical - Right Rotation 62   Cervical - Left Rotation 65          [facility] Adult PT Treatment/Exercise - 06/21/16 0001      Shoulder Exercises: Supine   Other Supine Exercises snow angel prolonged stretch 2-3 min      Shoulder Exercises: Standing   External Rotation Strengthening;Both;10 reps;Theraband   Theraband Level (Shoulder External Rotation) Level 1 (Yellow)   Extension Strengthening;Both;10 reps;Theraband   Theraband Level (Shoulder Extension) Level 2 (Red)   Row Both;10 reps;Theraband   Theraband Level (Shoulder Row) Level 2 (Red)     Shoulder Exercises: ROM/Strengthening   UBE (Upper Arm Bike) L3 x 3 min alt fwd/back      Shoulder Exercises: Stretch   Internal Rotation Stretch 5 reps   Other Shoulder Stretches med and high doorway stretch x 30 sec x 3 reps each position, each arm    Other Shoulder Stretches Thoracic ext with hands supporting head, seated in chair x 2 reps,  shoulder extension with stick x  5 reps (mirror for visual feedback)      Modalities   Modalities --  pt declined further modalities.      Iontophoresis   Type of Iontophoresis Dexamethasone   Location ant Lt shd (pec minor region)   Dose 71m   Time 12 hr patch      Manual Therapy   Soft tissue mobilization Lt pec / ant shoulder           PT Education - 06/21/16 0758    Education provided Yes   Education Details HEP- discussed removing AAROM cane exercises, except in extension.  Pt informed on trigger point dry needling.    Person(s) Educated Patient   Methods Explanation   Comprehension Verbalized understanding             PT Long Term Goals - 06/21/16 0735      PT LONG TERM GOAL #1   Title Improve posture and alignment with patient to demo good posterior shoulder girdle contraction to improve mechanics and  function 07/13/16   Time 6   Period Weeks   Status On-going     PT LONG TERM GOAL #2   Title Increase Lt shoulder ROM to equal or greater than Rt shoulder 07/13/16   Time 6   Period Weeks   Status On-going  improving     PT LONG TERM GOAL #3   Title Patient reports increased endurance for work activities with decreased pain by 50-75% 07/13/16   Period Weeks   Status Achieved     PT LONG TERM GOAL #4   Title Independent in HEP 07/13/16   Time 6   Period Weeks   Status On-going     PT LONG TERM GOAL #5   Title Improve FOTO to </= 34% limitation 07/13/16   Time 6   Period Weeks   Status On-going               Plan - 06/21/16 0800    Clinical Impression Statement Pt demonstrated improved Lt shoulder ROM and cervical lateral flexion.  She reports increased endurance and decreased shoulder pain during work; has met LTG # 3. She continues to have tender trigger point in Lt ant shoulder; applied ionto treatment. Pt interested in dry needling next visit.     Rehab Potential Good   PT Frequency 2x / week   PT Duration 6 weeks   PT Treatment/Interventions Patient/family education;ADLs/Self Care Home Management;Cryotherapy;Electrical Stimulation;Iontophoresis 452mml Dexamethasone;Moist Heat;Traction;Ultrasound;Dry needling;Manual techniques;Therapeutic activities;Therapeutic exercise   PT Next Visit Plan Dry needling.  Progress HEP as tolerated.    Consulted and Agree with Plan of Care Patient      Patient will benefit from skilled therapeutic intervention in order to improve the following deficits and impairments:  Postural dysfunction, Improper body mechanics, Pain, Impaired UE functional use, Increased fascial restricitons, Increased muscle spasms, Decreased range of motion, Decreased mobility, Decreased activity tolerance  Visit Diagnosis: Chronic left shoulder pain  Other symptoms and signs involving the musculoskeletal system  Abnormal posture     Problem  List Patient Active Problem List   Diagnosis Date Noted  . Left shoulder pain 05/23/2016  . Cervical spinal stenosis 12/13/2015  . Diabetes [place] 2 in nonobese ([place]10/14/2014  . Essential hypertension 03/11/2013  . Glaucoma (increased eye pressure) 03/11/2013  . Menopausal and postmenopausal disorder 03/11/2013   JeKerin PernaPTA 06/21/16 8:06 AM  [id number]: [phone]  Fax:  [phone]  Name: Aliesha Dolata MRN: [id number] Date of Birth: 02-22-1961

## 2016-06-30 ENCOUNTER — Ambulatory Visit (INDEPENDENT_AMBULATORY_CARE_PROVIDER_SITE_OTHER): Payer: BLUE CROSS/BLUE SHIELD | Admitting: Rehabilitative and Restorative Service Providers"

## 2016-06-30 ENCOUNTER — Encounter: Payer: Self-pay | Admitting: Rehabilitative and Restorative Service Providers"

## 2016-06-30 DIAGNOSIS — R29898 Other symptoms and signs involving the musculoskeletal system: Secondary | ICD-10-CM

## 2016-06-30 DIAGNOSIS — G8929 Other chronic pain: Secondary | ICD-10-CM | POA: Diagnosis not present

## 2016-06-30 DIAGNOSIS — R293 Abnormal posture: Secondary | ICD-10-CM

## 2016-06-30 DIAGNOSIS — M25512 Pain in left shoulder: Secondary | ICD-10-CM

## 2016-06-30 NOTE — Therapy (Signed)
Angel [name], [place], [address] Phone: [phone]   Fax:  [phone]  Physical Therapy Treatment  Patient Details  Name: Angel [name] MRN: [id number] Date of Birth: 1960/06/19 Referring Provider: Dr Dianah Field   Encounter Date: 06/30/2016      PT End of Session - 06/30/16 1447    Visit Number 5   Number of Visits 12   Date for PT Re-Evaluation 07/13/16   PT Start Time [id number]   PT Stop Time 1540   PT Time Calculation (min) 53 min   Activity Tolerance Patient tolerated treatment well      Past Medical History:  Diagnosis Date  . Diabetes mellitus without complication ([place])   . Glaucoma   . History of bone density study   . Hypertension     Past Surgical History:  Procedure Laterality Date  . APPENDECTOMY    . TONSILLECTOMY AND ADENOIDECTOMY    . TUBAL LIGATION      There were no vitals filed for this visit.      Subjective Assessment - 06/30/16 1452    Subjective Ionto patch really helped the tightness and pain in the front of the Lt shoudler. Now having pain in the mid back area. Maybe because she has been working for 12 days in a row.    Currently in Pain? Yes   Pain Score 5    Pain Location Shoulder   Pain Orientation Left   Pain Descriptors / Indicators Dull;Sore   Pain Type Chronic pain   Pain Onset More than a month ago   Pain Frequency Constant            [facility] PT Assessment - 06/30/16 0001      Assessment   Medical Diagnosis Lt shoulder pain - adhesive capsulitis   Referring Provider Dr Dianah Field    Onset Date/Surgical Date 01/11/16   Hand Dominance Right   Next MD Visit 2/5/118     AROM   Right Shoulder Extension 51 Degrees   Right Shoulder Flexion 157 Degrees   Right Shoulder ABduction 163 Degrees   Right Shoulder Internal Rotation 50 Degrees   Right Shoulder External Rotation 90 Degrees   Left Shoulder Extension 54 Degrees   Left Shoulder Flexion  143 Degrees   Left Shoulder ABduction 148 Degrees   Left Shoulder Internal Rotation 45 Degrees   Left Shoulder External Rotation 90 Degrees   Cervical - Right Side Bend 54   Cervical - Left Side Bend 68   Cervical - Right Rotation 72   Cervical - Left Rotation 74                     [facility] Adult PT Treatment/Exercise - 06/30/16 0001      Shoulder Exercises: Therapy Ball   Flexion 5 reps  stepping under large ball to stretch shoulder flexors 20 sec     Shoulder Exercises: Stretch   Internal Rotation Stretch 5 reps   Other Shoulder Stretches med and high doorway stretch x 30 sec x 3 reps each position, each arm      Iontophoresis   Type of Iontophoresis Dexamethasone   Location ant Lt shd (pec minor region); second patch Lt leveator scap    Dose 120 mAmp   Time 8 hr patch     Manual Therapy   Manual therapy comments pt prone and supine    Soft tissue mobilization bilat thoracic paraspinals; periscapular musculature Lt > Rt; pec  major/minor Lt    Myofascial Release thoracic praspinals; pecs Lt    Scapular Mobilization Lt scapula in multiple planes pt prone    Passive ROM Lt shoulder flexion, abd, ER, IR                      PT Long Term Goals - 06/30/16 1447      PT LONG TERM GOAL #1   Title Improve posture and alignment with patient to demo good posterior shoulder girdle contraction to improve mechanics and function 07/13/16   Time 6   Period Weeks   Status On-going     PT LONG TERM GOAL #2   Title Increase Lt shoulder ROM to equal or greater than Rt shoulder 07/13/16   Time 6   Period Weeks   Status On-going     PT LONG TERM GOAL #3   Title Patient reports increased endurance for work activities with decreased pain by 50-75% 07/13/16   Time 6   Period Weeks   Status Achieved     PT LONG TERM GOAL #4   Title Independent in HEP 07/13/16   Time 6   Period Weeks   Status On-going     PT LONG TERM GOAL #5   Title Improve FOTO to </= 34%  limitation 07/13/16   Time 6   Period Weeks   Status On-going               Plan - 06/30/16 1541    Clinical Impression Statement Improved Lt shoulder and cervical ROM. Continued pain at end ranges Lt shoulder motions. Patient has increase in muscular tightness through Lt shoudler girdle especially through the pecs. 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 Patient is progressing toward goals of therapy but will benefit form continued PT to achieve goals and reach maximum rehab potential.     Rehab Potential Good   PT Frequency 2x / week   PT Duration 6 weeks   PT Treatment/Interventions Patient/family education;ADLs/Self Care Home Management;Cryotherapy;Electrical Stimulation;Iontophoresis 4mg /ml Dexamethasone;Moist Heat;Traction;Ultrasound;Dry needling;Manual techniques;Therapeutic activities;Therapeutic exercise   PT Next Visit Plan Dry needling as indiacted; manual work; joint mobs; PROM; stretching Lt shoudler.  Progress HEP as tolerated.    Consulted and Agree with Plan of Care Patient     Note to MD for appt 07/03/16  Patient will benefit from skilled therapeutic intervention in order to improve the following deficits and impairments:  Postural dysfunction, Improper body mechanics, Pain, Impaired UE functional use, Increased fascial restricitons, Increased muscle spasms, Decreased range of motion, Decreased mobility, Decreased activity tolerance  Visit Diagnosis: Chronic left shoulder pain  Other symptoms and signs involving the musculoskeletal system  Abnormal posture     Problem List Patient Active Problem List   Diagnosis Date Noted  . Left shoulder pain 05/23/2016  . Cervical spinal stenosis 12/13/2015  . Diabetes mellitus type 2 in nonobese ([place]) 03/11/2013  . Essential hypertension 03/11/2013  . Glaucoma (increased eye pressure) 03/11/2013  . Menopausal and postmenopausal disorder 03/11/2013     Junior Huezo Nilda Simmer PT, MPH  06/30/2016, 3:46 PM  [facility] [place], [place], [address] Phone: [phone]   Fax:  [phone]  Name: Angel [name] MRN: [id number] Date of Birth: [birth year]

## 2016-07-03 ENCOUNTER — Ambulatory Visit (INDEPENDENT_AMBULATORY_CARE_PROVIDER_SITE_OTHER): Payer: BLUE CROSS/BLUE SHIELD | Admitting: Sports Medicine

## 2016-07-03 ENCOUNTER — Encounter: Payer: Self-pay | Admitting: Sports Medicine

## 2016-07-03 DIAGNOSIS — M4802 Spinal stenosis, cervical region: Secondary | ICD-10-CM

## 2016-07-03 DIAGNOSIS — M7502 Adhesive capsulitis of left shoulder: Secondary | ICD-10-CM | POA: Diagnosis not present

## 2016-07-03 NOTE — Progress Notes (Signed)
  Subjective:    CC: Follow-up  HPI: Adhesive capsulitis: Left-sided, resolved after glenohumeral joint injection and formal physical therapy.  Cervical degenerative disc disease: Has been seeing a pain provider at [facility], has had 3 epidurals, the most recent of which was 2 months ago. Still has some right-sided radiating pain from the neck to the dorsal hand, as well as periscapular symptoms around the rhomboids. She is only taking 300 mg of gabapentin twice a day, and Zanaflex.  Past medical history:  Negative.  See flowsheet/record as well for more information.  Surgical history: Negative.  See flowsheet/record as well for more information.  Family history: Negative.  See flowsheet/record as well for more information.  Social history: Negative.  See flowsheet/record as well for more information.  Allergies, and medications have been entered into the medical record, reviewed, and no changes needed.   Review of Systems: No fevers, chills, night sweats, weight loss, chest pain, or shortness of breath.   Objective:    General: Well Developed, well nourished, and in no acute distress.  Neuro: Alert and oriented x3, extra-ocular muscles intact, sensation grossly intact.  HEENT: Normocephalic, atraumatic, pupils equal round reactive to light, neck supple, no masses, no lymphadenopathy, thyroid nonpalpable.  Skin: Warm and dry, no rashes. Cardiac: Regular rate and rhythm, no murmurs rubs or gallops, no lower extremity edema.  Respiratory: Clear to auscultation bilaterally. Not using accessory muscles, speaking in full sentences.  Impression and Recommendations:    Adhesive capsulitis of left shoulder Injection and physical therapy has resolved all of her symptoms  Cervical spinal stenosis Doing well for the most part, has been released from her pain doctor, is post 3 cervical epidurals. She is having recurrence of right sided C7 radicular as well as periscapular  pain. Only on 300 mg of gabapentin twice a day, increasing to 600 twice a day. Return in one month to discuss this  I spent 25 minutes with this patient, greater than 50% was face-to-face time counseling regarding the above diagnoses

## 2016-07-03 NOTE — Assessment & Plan Note (Signed)
Doing well for the most part, has been released from her pain doctor, is post 3 cervical epidurals. She is having recurrence of right sided C7 radicular as well as periscapular pain. Only on 300 mg of gabapentin twice a day, increasing to 600 twice a day. Return in one month to discuss this

## 2016-07-03 NOTE — Assessment & Plan Note (Signed)
Injection and physical therapy has resolved all of her symptoms

## 2016-07-05 ENCOUNTER — Ambulatory Visit (INDEPENDENT_AMBULATORY_CARE_PROVIDER_SITE_OTHER): Payer: BLUE CROSS/BLUE SHIELD | Admitting: Physical Therapy

## 2016-07-05 DIAGNOSIS — R293 Abnormal posture: Secondary | ICD-10-CM | POA: Diagnosis not present

## 2016-07-05 DIAGNOSIS — M25512 Pain in left shoulder: Secondary | ICD-10-CM | POA: Diagnosis not present

## 2016-07-05 DIAGNOSIS — R29898 Other symptoms and signs involving the musculoskeletal system: Secondary | ICD-10-CM

## 2016-07-05 DIAGNOSIS — G8929 Other chronic pain: Secondary | ICD-10-CM

## 2016-07-05 NOTE — Therapy (Signed)
[place], [place], [address] Phone: [phone]   Fax:  [phone]  Physical Therapy Treatment  Patient Details  Name: Angel [name] MRN: [id number] Date of Birth: 06/18/1960 Referring Provider: Dr. Dianah Field   Encounter Date: 07/05/2016      PT End of Session - 07/05/16 0803    Visit Number 6   Number of Visits 12   Date for PT Re-Evaluation 07/13/16   PT Start Time 0759   PT Stop Time 0847   PT Time Calculation (min) 48 min      Past Medical History:  Diagnosis Date  . Diabetes mellitus without complication ([place])   . Glaucoma   . History of bone density study   . Hypertension     Past Surgical History:  Procedure Laterality Date  . APPENDECTOMY    . TONSILLECTOMY AND ADENOIDECTOMY    . TUBAL LIGATION      There were no vitals filed for this visit.      Subjective Assessment - 07/05/16 0803    Subjective Pt reports she had  2 hard days at work; her shoulder almost locked up with driving yesterday. She feels 85% improvement since starting therapy.    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 Dianah Field    Onset Date/Surgical Date 01/11/16   Hand Dominance Right   Next MD Visit March 2018     AROM   Left Shoulder Flexion 149 Degrees          [facility] Adult PT Treatment/Exercise - 07/05/16 0001      Shoulder Exercises: Standing   External Rotation Strengthening;Both;10 reps;Theraband  2 sets   Theraband Level (Shoulder External Rotation) Level 2 (Red)   Other Standing Exercises sash with Lt UE x  5 reps (unable to tolerate resistance, limited tolerance for this motion)      Shoulder Exercises: ROM/Strengthening   UBE (Upper Arm Bike) L3 x 4 min alt fwd/back      Shoulder Exercises: Stretch   Internal Rotation Stretch 3 reps   Wall Stretch - Flexion 3 reps;10 seconds   Other Shoulder Stretches med and high doorway stretch x 30 sec x 3 reps each position, each arm      Modalities   Modalities Iontophoresis;Electrical Stimulation;Ultrasound     Theme park manager see [place]   Electrical Stimulation Action COMBO [place]   Electrical Stimulation Parameters to tolerance     Ultrasound   Ultrasound Location Lt thoracic paraspinals/ levator insertion/ rhomboid   Ultrasound Parameters COMBO [place]- 100%, 1.2 w/cm2, 8 min      Iontophoresis   Type of Iontophoresis Dexamethasone   Location ant Lt shd (pec minor region)   Dose 120 mA   Time 8 hr patch     Manual Therapy   Manual therapy comments pt prone and supine    Soft tissue mobilization cross fiber and TPR to Lt  thoracic paraspinals; periscapular musculature Lt;  pec  Myofascial Release thoracic praspinals; pecs Lt    Scapular Mobilization Lt scapula in multiple planes pt supine                      PT Long Term Goals - 07/05/16 1133      PT LONG TERM GOAL #1   Title Improve posture and alignment with patient to demo good posterior shoulder girdle contraction to improve mechanics and function 07/13/16   Time 6   Period Weeks   Status On-going     PT LONG TERM GOAL #2   Title Increase Lt shoulder ROM to equal or greater than Rt shoulder 07/13/16   Time 6   Period Weeks   Status On-going     PT LONG TERM GOAL #3   Title Patient reports increased endurance for work activities with decreased pain by 50-75% 07/13/16   Time 6   Period Weeks   Status Achieved     PT LONG TERM GOAL #4   Title Independent in HEP 07/13/16   Time 6   Period Weeks   Status On-going     PT LONG  TERM GOAL #5   Title Improve FOTO to </= 34% limitation 07/13/16   Time 6   Period Weeks   Status On-going               Plan - 07/05/16 1134    Clinical Impression Statement Pt demonstrated improved Lt shoulder flexion. She continues to have pain and poor scapulohumeral motion when Lt UE over 90 deg of flex/ abdct.  She reported decrease in pain in thoracic paraspinals/post shoulder girdle after combo [place].  Progressing towards goals. Pt voiced interest in dry needling next session and continuation of therapy to reach goals.    Rehab Potential Good   PT Frequency 2x / week   PT Duration 6 weeks   PT Treatment/Interventions Patient/family education;ADLs/Self Care Home Management;Cryotherapy;Electrical Stimulation;Iontophoresis 4mg /ml Dexamethasone;Moist Heat;Traction;Ultrasound;Dry needling;Manual techniques;Therapeutic activities;Therapeutic exercise   PT Next Visit Plan Dry needling as indicated; manual work; joint mobs; PROM; stretching Lt shoudler.  Progress HEP as tolerated.    PT Home Exercise Plan Pt to decrease strengthening to every other day, stretches every day.     Consulted and Agree with Plan of Care Patient      Patient will benefit from skilled therapeutic intervention in order to improve the following deficits and impairments:  Postural dysfunction, Improper body mechanics, Pain, Impaired UE functional use, Increased fascial restricitons, Increased muscle spasms, Decreased range of motion, Decreased mobility, Decreased activity tolerance  Visit Diagnosis: Chronic left shoulder pain  Other symptoms and signs involving the musculoskeletal system  Abnormal posture     Problem List Patient Active Problem List   Diagnosis Date Noted  . Adhesive capsulitis of left shoulder 05/23/2016  . Cervical spinal stenosis 12/13/2015  . Diabetes mellitus type 2 in nonobese ([place]) 03/11/2013  . Essential hypertension 03/11/2013  . Glaucoma (increased eye pressure) 03/11/2013   . Menopausal and postmenopausal disorder 03/11/2013   Kerin Perna, PTA 07/05/16 12:52 PM  [place], [place], [address] Phone: [phone]   Fax:  [phone]  Name: Angel [name] MRN: [id number] Date of Birth: Aug 18, 1960

## 2016-07-07 ENCOUNTER — Telehealth: Payer: Self-pay

## 2016-07-07 ENCOUNTER — Other Ambulatory Visit: Payer: Self-pay | Admitting: Osteopathic Medicine

## 2016-07-07 NOTE — Telephone Encounter (Signed)
Angel [name] called for a refill on Tramadol. She states she sees Dr Dianah Field for neck pain. Please advise.

## 2016-07-07 NOTE — Telephone Encounter (Signed)
This probably needs to be directed back to Dr. Jimmye Norman at [facility] who has been prescribing it, he is her pain doctor.

## 2016-07-10 ENCOUNTER — Telehealth: Payer: Self-pay | Admitting: Emergency Medicine

## 2016-07-10 NOTE — Telephone Encounter (Signed)
Spoke with patient about Tramadol refill request. She no longer goes to Dr. Jimmye Norman and is using Dr. Sheppard Coil as PCP; was told by her to have you handle her pain issues. This is why she wants refill from you.

## 2016-07-10 NOTE — Telephone Encounter (Signed)
I can do it, how often is she taking it?

## 2016-07-10 NOTE — Telephone Encounter (Signed)
As far as my last visit with the patient, she was seeing pain management for cervical spine issues and had some other MSK complaints which I did not have time to address at her visit but thought sports med may be able to help with. I have not spoken with this patient about pain management specifically since she was seeing specialist and as far as I knew she had no intention of not going to them anymore. Let me know if any other questions. I would want to see her back in the office if she is going to request pain meds form [place] long-term.

## 2016-07-12 ENCOUNTER — Telehealth: Payer: Self-pay | Admitting: Sports Medicine

## 2016-07-12 ENCOUNTER — Ambulatory Visit (INDEPENDENT_AMBULATORY_CARE_PROVIDER_SITE_OTHER): Payer: BLUE CROSS/BLUE SHIELD | Admitting: Physical Therapy

## 2016-07-12 DIAGNOSIS — M25512 Pain in left shoulder: Secondary | ICD-10-CM

## 2016-07-12 DIAGNOSIS — G8929 Other chronic pain: Secondary | ICD-10-CM

## 2016-07-12 DIAGNOSIS — R29898 Other symptoms and signs involving the musculoskeletal system: Secondary | ICD-10-CM

## 2016-07-12 DIAGNOSIS — R293 Abnormal posture: Secondary | ICD-10-CM | POA: Diagnosis not present

## 2016-07-12 NOTE — Telephone Encounter (Signed)
Patient stopped by inquiring about the status of this prescription. She stated that she is no longer under the care of Dr. Jimmye Norman and was released from his care in December 2017. I told her she would need to make a follow up appointment to be prescribed long-term pain meds from our office. She said she already has an appointment in April, but I told her she may have to make another appointment before then. Also, she takes 50 mg twice a day.

## 2016-07-12 NOTE — Telephone Encounter (Signed)
Patient stopped in inquiring about a decision

## 2016-07-12 NOTE — Patient Instructions (Signed)

## 2016-07-12 NOTE — Therapy (Signed)
[place], [place], [address] Phone: [phone]   Fax:  [phone]  Physical Therapy Treatment  Patient Details  Name: Angel [name] MRN: [id number] Date of Birth: [birth year] Referring Provider: Dr. Dianah Field   Encounter Date: 07/12/2016      PT End of Session - 07/12/16 1613    Visit Number 7   Number of Visits 12   Date for PT Re-Evaluation 07/13/16   PT Start Time 1518   PT Stop Time 1625   PT Time Calculation (min) 67 min   Activity Tolerance Patient tolerated treatment well      Past Medical History:  Diagnosis Date  . Diabetes mellitus without complication ([place])   . Glaucoma   . History of bone density study   . Hypertension     Past Surgical History:  Procedure Laterality Date  . APPENDECTOMY    . TONSILLECTOMY AND ADENOIDECTOMY    . TUBAL LIGATION      There were no vitals filed for this visit.      Subjective Assessment - 07/12/16 1522    Currently in Pain? Yes   Pain Score 8    Pain Orientation Left                         [facility] Adult PT Treatment/Exercise - 07/12/16 0001      Shoulder Exercises: ROM/Strengthening   UBE (Upper Arm Bike) L3 x 4 min alt fwd/back      Shoulder Exercises: Stretch   Other Shoulder Stretches upper trap stretches   Other Shoulder Stretches mid back stretches     Modalities   Modalities Moist Heat;Electrical Stimulation     Moist Heat Therapy   Number Minutes Moist Heat 20 Minutes   Moist Heat Location Cervical;Shoulder  thoracic     Electrical Stimulation   Electrical Stimulation Location upper traps   Electrical Stimulation Action Premod   Electrical Stimulation Parameters to tolerance   Electrical Stimulation Goals Tone;Pain     Iontophoresis   Type of Iontophoresis Dexamethasone   Location ant Lt shd (pec minor region)   Dose 120 mA   Time 8 hr patch     Manual Therapy   Soft tissue mobilization  bilat upper traps, levators., Lt teres minor.major, Rt rhomboids and paraspinatl thoracis          Trigger Point Dry Needling - 07/12/16 1557    Consent Given? Yes   Education Handout Provided No   Muscles Treated Upper Body Upper trapezius;Levator scapulae;Rhomboids  teres minor & rhomboids Lt others bilat   Upper Trapezius Response Palpable increased muscle length;Twitch reponse elicited   Levator Scapulae Response Palpable increased muscle length;Twitch response elicited   Rhomboids Response Palpable increased muscle length;Twitch response elicited  Rt lower with stim                   PT Long Term Goals - 07/05/16 1133      PT LONG TERM GOAL #1   Title Improve posture and alignment with patient to demo good posterior shoulder girdle contraction to improve mechanics and function 07/13/16   Time 6   Period Weeks   Status On-going     PT LONG TERM GOAL #2   Title Increase Lt shoulder ROM to equal or greater than Rt shoulder 07/13/16   Time 6   Period Weeks   Status On-going     PT LONG TERM GOAL #3  Title Patient reports increased endurance for work activities with decreased pain by 50-75% 07/13/16   Time 6   Period Weeks   Status Achieved     PT LONG TERM GOAL #4   Title Independent in HEP 07/13/16   Time 6   Period Weeks   Status On-going     PT LONG TERM GOAL #5   Title Improve FOTO to </= 34% limitation 07/13/16   Time 6   Period Weeks   Status On-going               Plan - 07/12/16 1613    Clinical Impression Statement Sibbie tolerated SN well, has a lot of twitches in her upper traps and a large one in the Rt thoracic paraspinals and rhomboids.  Increased tissue mobility and less pain after treatmentt.    Rehab Potential Good   PT Frequency 2x / week   PT Duration 6 weeks   PT Treatment/Interventions Patient/family education;ADLs/Self Care Home Management;Cryotherapy;Electrical Stimulation;Iontophoresis 4mg /ml Dexamethasone;Moist  Heat;Traction;Ultrasound;Dry needling;Manual techniques;Therapeutic activities;Therapeutic exercise   PT Home Exercise Plan assess response to DN   Consulted and Agree with Plan of Care Patient      Patient will benefit from skilled therapeutic intervention in order to improve the following deficits and impairments:  Postural dysfunction, Improper body mechanics, Pain, Impaired UE functional use, Increased fascial restricitons, Increased muscle spasms, Decreased range of motion, Decreased mobility, Decreased activity tolerance  Visit Diagnosis: Chronic left shoulder pain  Other symptoms and signs involving the musculoskeletal system  Abnormal posture     Problem List Patient Active Problem List   Diagnosis Date Noted  . Adhesive capsulitis of left shoulder 05/23/2016  . Cervical spinal stenosis 12/13/2015  . Diabetes mellitus type 2 in nonobese ([place]) 03/11/2013  . Essential hypertension 03/11/2013  . Glaucoma (increased eye pressure) 03/11/2013  . Menopausal and postmenopausal disorder 03/11/2013    Jeral Pinch PT 07/12/2016, 4:15 PM  [facility] [place], [place], [address] Phone: [phone]   Fax:  [phone]  Name: Angel [name] MRN: [id number] Date of Birth: [birth year]

## 2016-07-13 MED ORDER — TRAMADOL HCL 50 MG PO TABS: 50.0000 mg | ORAL_TABLET | Freq: Two times a day (BID) | ORAL | 3 refills | Status: DC

## 2016-07-13 NOTE — Telephone Encounter (Signed)
Rx in box for tramadol.  Please let her know we should switch to daily extended release at some point.

## 2016-07-18 ENCOUNTER — Other Ambulatory Visit: Payer: Self-pay | Admitting: Osteopathic Medicine

## 2016-07-20 ENCOUNTER — Ambulatory Visit (INDEPENDENT_AMBULATORY_CARE_PROVIDER_SITE_OTHER): Payer: BLUE CROSS/BLUE SHIELD | Admitting: Physical Therapy

## 2016-07-20 ENCOUNTER — Encounter: Payer: Self-pay | Admitting: Physical Therapy

## 2016-07-20 DIAGNOSIS — R29898 Other symptoms and signs involving the musculoskeletal system: Secondary | ICD-10-CM

## 2016-07-20 DIAGNOSIS — M25512 Pain in left shoulder: Secondary | ICD-10-CM | POA: Diagnosis not present

## 2016-07-20 DIAGNOSIS — R293 Abnormal posture: Secondary | ICD-10-CM | POA: Diagnosis not present

## 2016-07-20 DIAGNOSIS — G8929 Other chronic pain: Secondary | ICD-10-CM | POA: Diagnosis not present

## 2016-07-20 NOTE — Therapy (Signed)
[place], [place], [address] Phone: [phone]   Fax:  [phone]  Physical Therapy Treatment  Patient Details  Name: Angel [name] MRN: [id number] Date of Birth: 12-24-1960 Referring Provider: Dr Dianah Field  Encounter Date: 07/20/2016      PT End of Session - 07/20/16 1621    Visit Number 8   Number of Visits 12   Date for PT Re-Evaluation 08/17/16   PT Start Time 5320   PT Stop Time 1721   PT Time Calculation (min) 60 min   Activity Tolerance Patient tolerated treatment well      Past Medical History:  Diagnosis Date  . Diabetes mellitus without complication ([place])   . Glaucoma   . History of bone density study   . Hypertension     Past Surgical History:  Procedure Laterality Date  . APPENDECTOMY    . TONSILLECTOMY AND ADENOIDECTOMY    . TUBAL LIGATION      There were no vitals filed for this visit.      Subjective Assessment - 07/20/16 1622    Subjective Pt reports that her reach is improved, she has picked up her exercise as she felt like she was loosing strength.  Seh thinks the DN is helping.    Patient Stated Goals get rid of pain and use arm normally again    Currently in Pain? Yes   Pain Score 4    Pain Location Shoulder   Pain Orientation Posterior;Left   Pain Descriptors / Indicators Sore   Pain Type Chronic pain   Pain Onset More than a month ago   Pain Frequency Constant   Aggravating Factors  not sure, its a different more localized pain   Pain Relieving Factors lying on a ball and ice/heat TENs            [facility] PT Assessment - 07/20/16 0001      Assessment   Medical Diagnosis Lt shoulder pain - adhesive capsulitis   Referring Provider Dr Dianah Field   Onset Date/Surgical Date 01/11/16   Hand Dominance Right     Observation/Other Assessments   Focus on Therapeutic Outcomes (FOTO)  41% limited     AROM   Left Shoulder Extension 54 Degrees   slight pain ant shoulder   Left Shoulder Flexion 164 Degrees   Left Shoulder ABduction 142 Degrees  with pain   Left Shoulder Internal Rotation 38 Degrees  standing with pain, 62 after DN   Left Shoulder External Rotation 70 Degrees  standing with pain, 90 after DN                      [facility] Adult PT Treatment/Exercise - 07/20/16 0001      Shoulder Exercises: Stretch   Other Shoulder Stretches doorway mid/high, with strap shoulder flex Lt & chest      Modalities   Modalities Moist Heat;Electrical Stimulation     Moist Heat Therapy   Number Minutes Moist Heat 20 Minutes   Moist Heat Location Shoulder;Cervical  thoracic     Electrical Stimulation   Electrical Stimulation Location Lt posterior shoulder complex   Electrical Stimulation Action IFC   Electrical Stimulation Parameters to tolerance   Electrical Stimulation Goals Tone;Pain     Iontophoresis   Type of Iontophoresis Dexamethasone   Location ant Lt shd (pec minor region)   Dose 170mmp, 1.0cc   Time 8 hr patch     Manual Therapy  Soft tissue mobilization Lt upper traps/levators/teres minor/major all Lt STW           Trigger Point Dry Needling - 07/20/16 1631    Consent Given? Yes   Education Handout Provided No   Muscles Treated Upper Body Levator scapulae  teres minor/major Lt side .   Levator Scapulae Response Palpable increased muscle length;Twitch response elicited                   PT Long Term Goals - 07/20/16 1624      PT LONG TERM GOAL #1   Title Improve posture and alignment with patient to demo good posterior shoulder girdle contraction to improve mechanics and function 07/13/16   Status Achieved     PT LONG TERM GOAL #2   Title Increase Lt shoulder ROM to equal or greater than Rt shoulder 08/17/16   Status Partially Met  lacking in abduction, IR, slightly in ER      PT LONG TERM GOAL #3   Title Patient reports increased endurance for work activities with decreased  pain by 50-75% 07/13/16   Status Achieved     PT LONG TERM GOAL #4   Title Independent in HEP 08/17/16   Time 4   Period Weeks   Status On-going     PT LONG TERM GOAL #5   Title Improve FOTO to </= 34% limitation 08/17/16   Time 4   Status On-going  scored 41% limited today.                Plan - 07/20/16 1703    Clinical Impression Statement Angel [name] is making progress, she had increased Lt shoulder ROM after needling today.  Her pain is slowly decreasing and becoming more localized.  She would benefit from some more treatments to continue with needling and improving functional use of her arm.    Rehab Potential Good   PT Frequency 1x / week   PT Duration 4 weeks   PT Treatment/Interventions Patient/family education;ADLs/Self Care Home Management;Cryotherapy;Electrical Stimulation;Iontophoresis 79m/ml Dexamethasone;Moist Heat;Traction;Ultrasound;Dry needling;Manual techniques;Therapeutic activities;Therapeutic exercise   PT Next Visit Plan Dry needling as indicated; manual work; joint mobs; PROM; stretching Lt shoudler.  Progress HEP as tolerated.    Consulted and Agree with Plan of Care Patient      Patient will benefit from skilled therapeutic intervention in order to improve the following deficits and impairments:  Postural dysfunction, Improper body mechanics, Pain, Impaired UE functional use, Increased fascial restricitons, Increased muscle spasms, Decreased range of motion, Decreased mobility, Decreased activity tolerance  Visit Diagnosis: Chronic left shoulder pain - Plan: PT plan of care cert/re-cert  Other symptoms and signs involving the musculoskeletal system - Plan: PT plan of care cert/re-cert  Abnormal posture - Plan: PT plan of care cert/re-cert     Problem List Patient Active Problem List   Diagnosis Date Noted  . Adhesive capsulitis of left shoulder 05/23/2016  . Cervical spinal stenosis 12/13/2015  . Diabetes mellitus type 2 in nonobese ([place]  03/11/2013  . Essential hypertension 03/11/2013  . Glaucoma (increased eye pressure) 03/11/2013  . Menopausal and postmenopausal disorder 03/11/2013    SJeral PinchPT  07/20/2016, 5:07 PM  [facility] [address]Phone: [phone]  Fax:  [phone] Name: Angel ZillmerMRN: [id number] of Birth: 3August 20, 1962

## 2016-07-23 NOTE — Telephone Encounter (Signed)
See other telephone message 

## 2016-07-26 ENCOUNTER — Ambulatory Visit (INDEPENDENT_AMBULATORY_CARE_PROVIDER_SITE_OTHER): Payer: BLUE CROSS/BLUE SHIELD | Admitting: Physical Therapy

## 2016-07-26 DIAGNOSIS — G8929 Other chronic pain: Secondary | ICD-10-CM

## 2016-07-26 DIAGNOSIS — M25512 Pain in left shoulder: Secondary | ICD-10-CM

## 2016-07-26 DIAGNOSIS — R29898 Other symptoms and signs involving the musculoskeletal system: Secondary | ICD-10-CM

## 2016-07-26 DIAGNOSIS — R293 Abnormal posture: Secondary | ICD-10-CM | POA: Diagnosis not present

## 2016-07-26 NOTE — Therapy (Addendum)
[place], [place], [address] Phone: [phone]   Fax:  [phone]  Physical Therapy Treatment  Patient Details  Name: Maghen Group MRN: [id number] Date of Birth: 1960/12/30 Referring Provider: Dr. Dianah Field.   Encounter Date: 07/26/2016      PT End of Session - 07/26/16 1517    Visit Number 9   Number of Visits 12   Date for PT Re-Evaluation 08/17/16   PT Start Time 1518   PT Stop Time 1603   PT Time Calculation (min) 45 min   Activity Tolerance Patient limited by pain   Behavior During Therapy [facility] for tasks assessed/performed      Past Medical History:  Diagnosis Date  . Diabetes mellitus without complication ([place])   . Glaucoma   . History of bone density study   . Hypertension     Past Surgical History:  Procedure Laterality Date  . APPENDECTOMY    . TONSILLECTOMY AND ADENOIDECTOMY    . TUBAL LIGATION      There were no vitals filed for this visit.      Subjective Assessment - 07/26/16 1522    Subjective Pt reports she had some relief from DN, but not as much as first experience.  She is just finished work and has increased pain.  She states she has returned to wearing a supportive shoulder strap last two days, but she feels it is not helping much.    Currently in Pain? Yes   Pain Score 8    Pain Location Shoulder   Pain Orientation Left;Posterior   Pain Descriptors / Indicators Sore;Constant   Aggravating Factors  work   Pain Relieving Factors ice/heat, TENS            [facility] PT Assessment - 07/26/16 0001      Assessment   Medical Diagnosis Lt shoulder pain - adhesive capsulitis   Referring Provider Dr. Dianah Field.    Onset Date/Surgical Date 01/11/16   Hand Dominance Right   Next MD Visit 07/31/16     AROM   Left Shoulder Extension 55 Degrees  no pain   Left Shoulder Flexion 157 Degrees  in supine; 147 deg standing with pain    Left Shoulder ABduction  127 Degrees  with pain standing    Left Shoulder External Rotation 87 Degrees  with pain, supine abdct 90 deg.          [facility] Adult PT Treatment/Exercise - 07/26/16 0001      Shoulder Exercises: ROM/Strengthening   UBE (Upper Arm Bike) L1: 2 min each direction.      Shoulder Exercises: Stretch   Cross Chest Stretch 5 reps;10 seconds   Wall Stretch - Flexion 2 reps;20 seconds   Other Shoulder Stretches doorway mid/high, with strap shoulder flex Lt & chest      Modalities   Modalities --  pt declined.      Manual Therapy   Manual Therapy Taping   Manual therapy comments pt prone and supine  some guarding in supine, switched to prone.    Soft tissue mobilization cross fiber friction to thoracic paraspinals; TPR to levator and rhomboid.     Myofascial Release to Lt thoracic paraspinals   Coker;Facilitate Muscle     Kinesiotix   Create Space I strips of Rock tape placed over bilat upper thoracic paraspinals and Rt levator/upper trap to decompress tissue and decrease pain.    Facilitate Muscle  I strips of Rock tape  placed over Lt ant/post deltoid.                       PT Long Term Goals - 07/26/16 1654      PT LONG TERM GOAL #1   Title Improve posture and alignment with patient to demo good posterior shoulder girdle contraction to improve mechanics and function 07/13/16   Time 6   Period Weeks   Status Achieved     PT LONG TERM GOAL #2   Title Increase Lt shoulder ROM to equal or greater than Rt shoulder 08/17/16   Time 6   Period Weeks   Status Partially Met     PT LONG TERM GOAL #3   Title Patient reports increased endurance for work activities with decreased pain by 50-75% 07/13/16   Time 6   Period Weeks   Status On-going  had achieved, but pain has returned.      PT LONG TERM GOAL #4   Title Independent in HEP 08/17/16   Time 4   Period Weeks   Status On-going     PT LONG TERM GOAL #5   Title Improve FOTO to </= 34% limitation  08/17/16   Time 4   Period Weeks   Status On-going               Plan - 07/26/16 1655    Clinical Impression Statement Pt demonstrated decreased Lt shoulder ROM this visit compared to last visit, but she was also reporting increased Lt shoulder pain and fatigue upon arrival.  She has had some relief with dry needling, and has had some improvement overall with functional use.  She had difficulty tolerating exercise due to increased pain level.  Pt declined modalities at end of session; states she will use once she is home.   Pt making gradual gains towards remaining goals.    Rehab Potential Good   PT Frequency 1x / week   PT Duration 4 weeks   PT Treatment/Interventions Patient/family education;ADLs/Self Care Home Management;Cryotherapy;Electrical Stimulation;Iontophoresis 39m/ml Dexamethasone;Moist Heat;Traction;Ultrasound;Dry needling;Manual techniques;Therapeutic activities;Therapeutic exercise   PT Next Visit Plan Dry needling as indicated; manual work; joint mobs; PROM; stretching Lt shoudler.  Progress HEP as tolerated.    Consulted and Agree with Plan of Care Patient      Patient will benefit from skilled therapeutic intervention in order to improve the following deficits and impairments:  Postural dysfunction, Improper body mechanics, Pain, Impaired UE functional use, Increased fascial restricitons, Increased muscle spasms, Decreased range of motion, Decreased mobility, Decreased activity tolerance  Visit Diagnosis: Chronic left shoulder pain  Other symptoms and signs involving the musculoskeletal system  Abnormal posture     Problem List Patient Active Problem List   Diagnosis Date Noted  . Adhesive capsulitis of left shoulder 05/23/2016  . Cervical spinal stenosis 12/13/2015  . 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 Essential hypertension 03/11/2013  . Glaucoma (increased eye pressure) 03/11/2013  . Menopausal and postmenopausal disorder  03/11/2013   JKerin Perna PTA 07/26/16 5:03 PM  [facility] [address] [address] [address]Phone: [phone]  Fax:  [phone] Name: NHye TrawickMRN: [id number] of Birth: 3December 01, 1962 PHYSICAL THERAPY DISCHARGE SUMMARY  Visits from Start of Care: 9  Current functional level related to goals / functional outcomes: See progress note for discharge status. Patient has been referred to specialist for further evaluation.   Remaining deficits: Unknown  Education / Equipment: HEP  Plan: Patient agrees to discharge.  Patient goals were partially met. Patient is being discharged due to                                                     ?????   Referred to specialist for second opinion.  Celyn P. Helene Kelp PT, MPH 08/30/16 12:22 PM

## 2016-07-31 ENCOUNTER — Ambulatory Visit (INDEPENDENT_AMBULATORY_CARE_PROVIDER_SITE_OTHER): Payer: BLUE CROSS/BLUE SHIELD | Admitting: Sports Medicine

## 2016-07-31 DIAGNOSIS — M4802 Spinal stenosis, cervical region: Secondary | ICD-10-CM

## 2016-07-31 MED ORDER — GABAPENTIN 600 MG PO TABS: ORAL_TABLET | ORAL | 3 refills | Status: DC

## 2016-07-31 NOTE — Progress Notes (Signed)
  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 MRI from 2014 showed C5-6 and C6-7 degenerative disc disease with bilateral foraminal stenosis at these 2 levels. She has had 3 epidurals the most recent of which was in December without much improvement. We increased her gabapentin last time to 600 mg twice a day, she hasn't noticed any improvement with this either.  Past medical history:  Negative.  See flowsheet/record as well for more information.  Surgical history: Negative.  See flowsheet/record as well for more information.  Family history: Negative.  See flowsheet/record as well for more information.  Social history: Negative.  See flowsheet/record as well for more information.  Allergies, and medications have been entered into the medical record, reviewed, and no changes needed.   Review of Systems: No fevers, chills, night sweats, weight loss, chest pain, or shortness of breath.   Objective:    General: Well Developed, well nourished, and in no acute distress.  Neuro: Alert and oriented x3, extra-ocular muscles intact, sensation grossly intact.  HEENT: Normocephalic, atraumatic, pupils equal round reactive to light, neck supple, no masses, no lymphadenopathy, thyroid nonpalpable.  Skin: Warm and dry, no rashes. Cardiac: Regular rate and rhythm, no murmurs rubs or gallops, no lower extremity edema.  Respiratory: Clear to auscultation bilaterally. Not using accessory muscles, speaking in full sentences.  Impression and Recommendations:    Cervical spinal stenosis Has had 3 epidurals, the most recent of which was 3 months ago, C6-C7 and C5-C6 degenerative changes with periscapular radicular pain as well as radicular pain down to the hand. Increasing gabapentin to 1200 mg twice a day, I'm going to get a new MRI and I would like her to touch base with one of our  neurosurgeons. I think we have maximized nonoperative treatment at this point.  I spent 25 minutes with this patient, greater than 50% was face-to-face time counseling regarding the above diagnoses

## 2016-07-31 NOTE — Assessment & Plan Note (Signed)
Has had 3 epidurals, the most recent of which was 3 months ago, C6-C7 and C5-C6 degenerative changes with periscapular radicular pain as well as radicular pain down to the hand. Increasing gabapentin to 1200 mg twice a day, I'm going to get a new MRI and I would like her to touch base with one of our neurosurgeons. I think we have maximized nonoperative treatment at this point.

## 2016-08-07 ENCOUNTER — Ambulatory Visit (INDEPENDENT_AMBULATORY_CARE_PROVIDER_SITE_OTHER): Payer: BLUE CROSS/BLUE SHIELD

## 2016-08-07 DIAGNOSIS — M542 Cervicalgia: Secondary | ICD-10-CM | POA: Diagnosis not present

## 2016-08-07 DIAGNOSIS — M4802 Spinal stenosis, cervical region: Secondary | ICD-10-CM | POA: Diagnosis not present

## 2016-08-07 DIAGNOSIS — M479 Spondylosis, unspecified: Secondary | ICD-10-CM | POA: Diagnosis not present

## 2016-08-07 IMAGING — MR MR CERVICAL SPINE W/O CM
5 series · 33 of 48 positions shown · non-contrast
Comparison: None.

ADDENDUM:
Comparison is made with submitted prior cervical MRI dated
[DATE].

In comparison with the prior MRI there is mild progression of
left-sided foraminal narrowing at the C4-5 level, now severe.
Additionally, there is a mild increase in discogenic degenerative
changes at the C4-5 and C5-6 levels with increase canal stenosis.
Reversal of cervical curvature is stable. Degenerative changes at
other levels appears grossly unchanged.
By: BARDALES M.D.
CLINICAL DATA: 56 y/o F; neck pain with bilateral shoulder pain,
upper arm pain, and numbness in the fingers.
EXAM:
MRI CERVICAL SPINE WITHOUT CONTRAST
TECHNIQUE: Multiplanar, multisequence MR imaging of the cervical spine was
performed. No intravenous contrast was administered.

[Series 2: T2 · sagittal · 3.0mm · 0.56mm/px · 8 of 13 slices shown (1 of 2)]
[im 1/13]
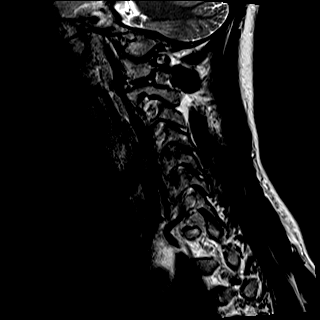
[im 2/13]
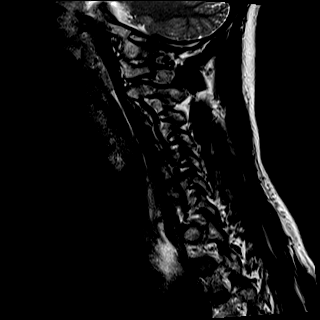
[im 4/13]
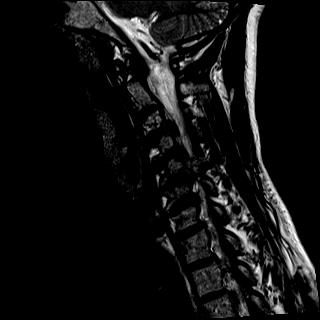
[im 6/13]
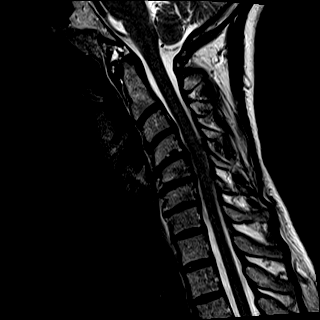
[im 7/13]
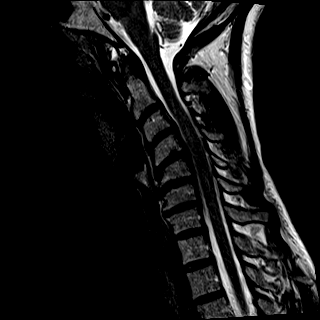
[im 9/13]
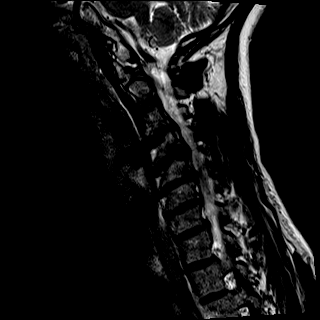
[im 11/13]
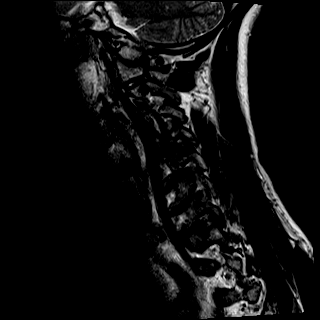
[im 13/13]
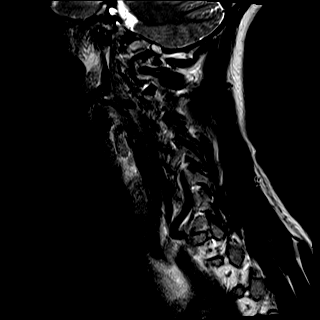

[Series 3: T1 · sagittal · 3.0mm · 0.70mm/px · 7 of 13 slices shown]
[im 1/13]
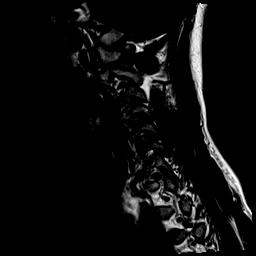
[im 3/13]
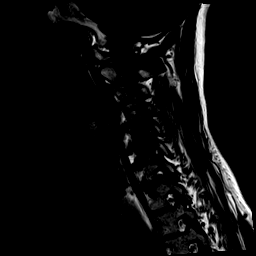
[im 5/13]
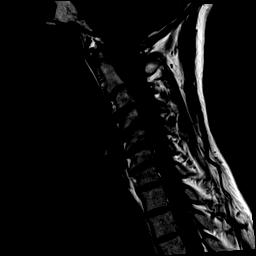
[im 7/13]
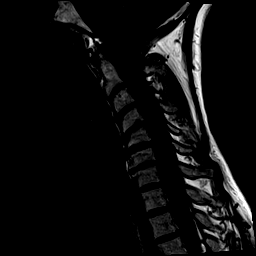
[im 9/13]
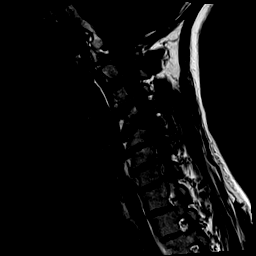
[im 11/13]
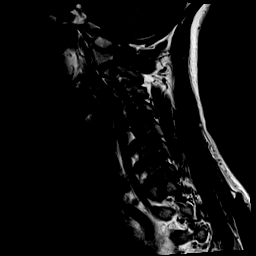
[im 13/13]
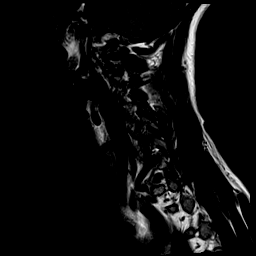

[Series 4: STIR · sagittal · 3.0mm · 0.35mm/px · 7 of 13 slices shown]
[im 1/13]
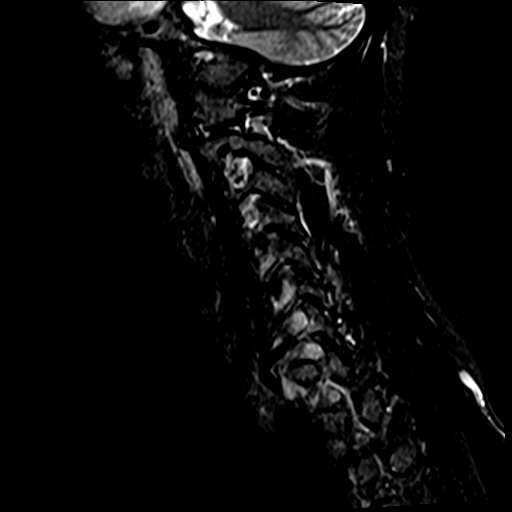
[im 3/13]
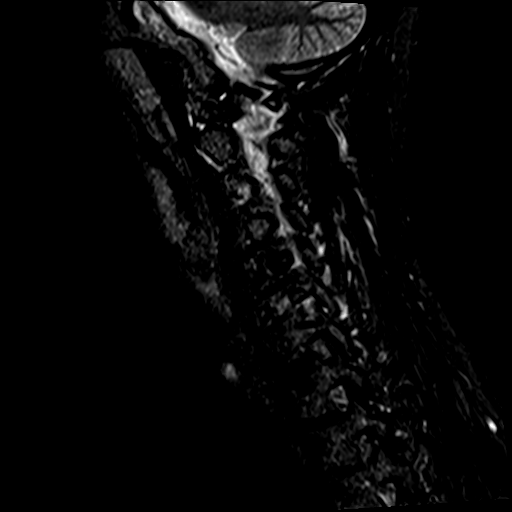
[im 5/13]
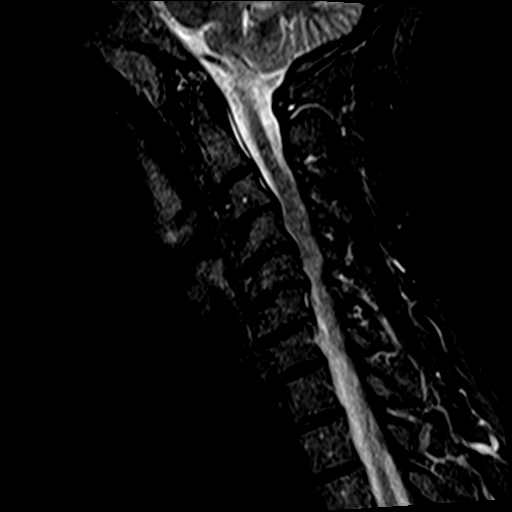
[im 7/13]
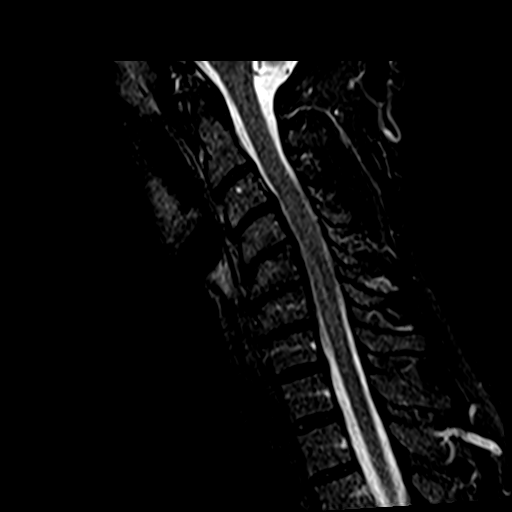
[im 9/13]
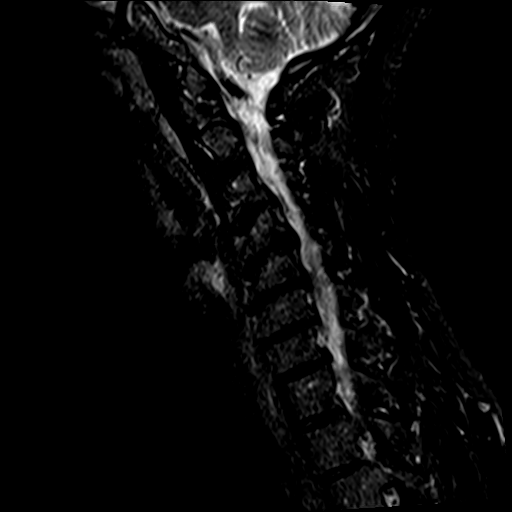
[im 11/13]
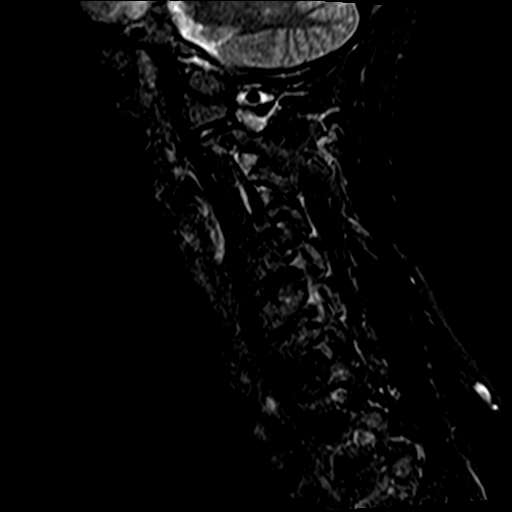
[im 13/13]
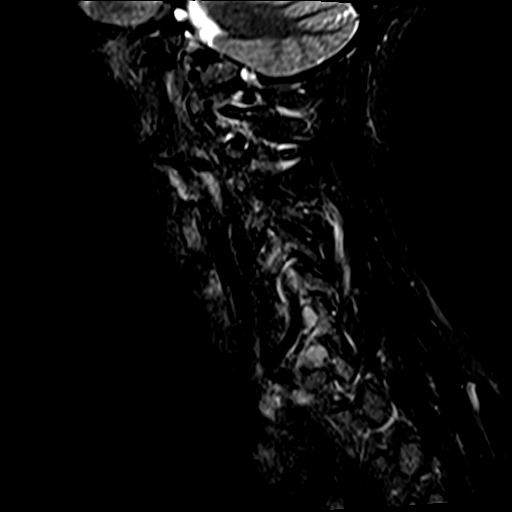

[Series 5: T2 · axial · 3.0mm · 0.62mm/px · z∈[-84,-0]mm · 9 of 24 slices shown (2 of 2)]
[im 1/24]
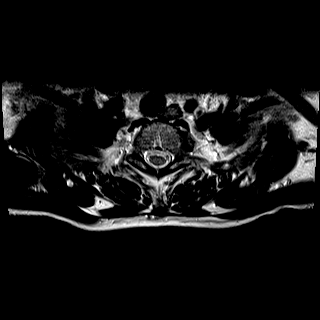
[im 4/24]
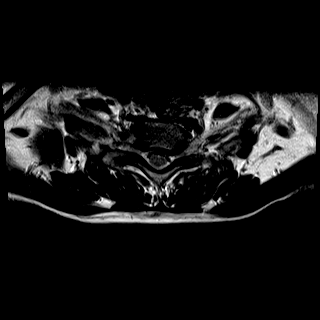
[im 8/24]
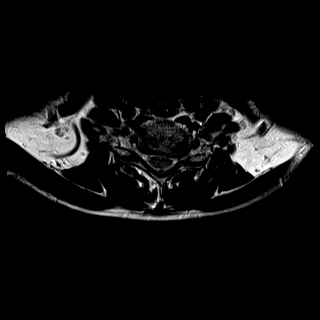
[im 10/24]
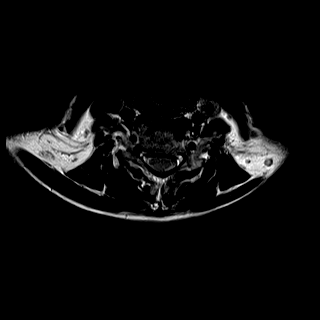
[im 12/24]
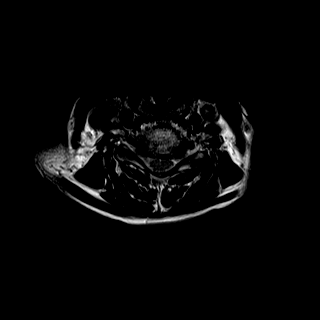
[im 14/24]
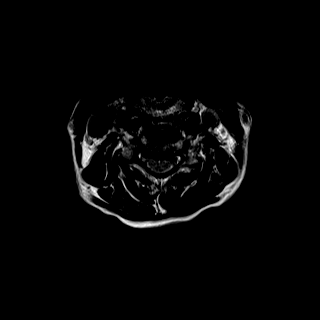
[im 16/24]
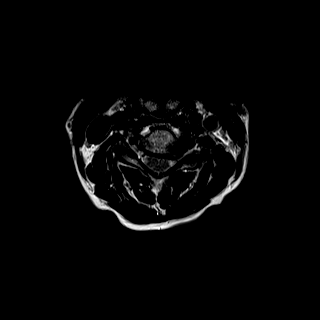
[im 20/24]
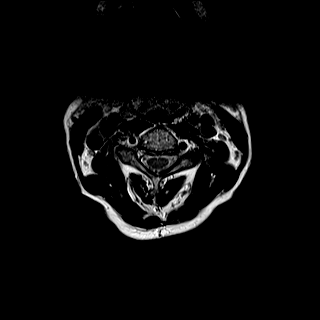
[im 24/24]
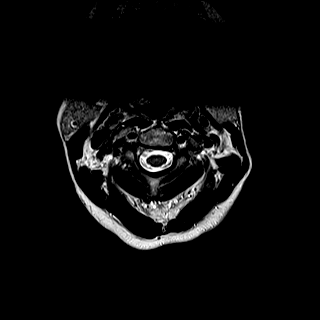

[Series 6: mpgr ax · axial · 3.0mm · 0.37mm/px · z∈[-74,-63]mm · 2 of 24 slices shown]
[im 1/24]
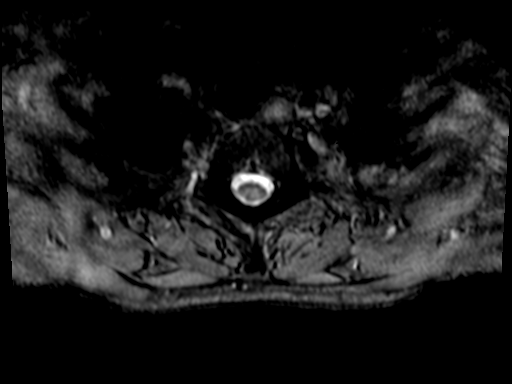
[im 4/24]
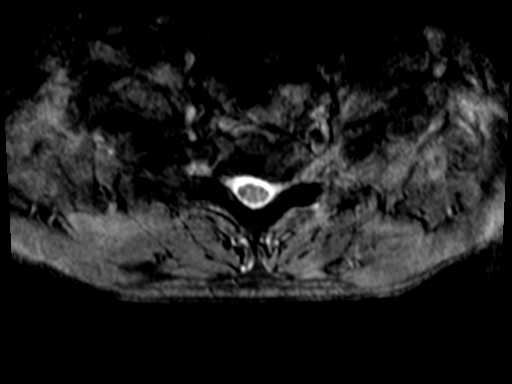

[33 of 48 positions shown; findings below may reference images not displayed]

FINDINGS: Alignment: Reversal cervical lordosis with apex at C4-5.

Vertebrae: No fracture, evidence of discitis, or bone lesion.

Cord: Normal signal and morphology.

Posterior Fossa, vertebral arteries, paraspinal tissues: Negative.

Disc levels:

C2-3: Small disc osteophyte complex and left-sided uncovertebral/
facet hypertrophy. Mild left foraminal narrowing. No significant
canal stenosis.

C3-4: Small disc osteophyte complex and bilateral uncovertebral/
facet hypertrophy. Mild canal stenosis and moderate bilateral
foraminal narrowing.

C4-5: Disc osteophyte complex eccentric to the left and
left-greater-than-right uncovertebral/ facet hypertrophy. Moderate
right and severe left foraminal narrowing. Mild canal stenosis.

C5-6: Disc osteophyte complex with right greater than left
uncovertebral and facet hypertrophy. Severe bilateral foraminal
narrowing. Mild canal stenosis.

C6-7: Disc osteophyte complex with left-greater-than-right
uncovertebral and facet hypertrophy. Moderate bilateral foraminal
narrowing. Mild canal stenosis.

C7-T1: Disc bulge eccentric to the left with moderate left and mild
right foraminal narrowing. No significant canal stenosis.
IMPRESSION: 1. No acute osseous abnormality or abnormal cord signal.
2. Moderate to severe cervical spondylosis for age with discogenic
and facet degenerative changes.
3. Multilevel mild canal stenosis from the C3-4 through C6-7 levels.
No high-grade canal stenosis.
4. Multiple levels of mild-to-moderate foraminal narrowing with
severe left foraminal narrowing at C4-5 severe bilateral narrowing
at C5-6.

By: BARDALES M.D.

## 2016-08-08 DIAGNOSIS — M4802 Spinal stenosis, cervical region: Secondary | ICD-10-CM | POA: Diagnosis not present

## 2016-08-09 ENCOUNTER — Other Ambulatory Visit: Payer: Self-pay | Admitting: Osteopathic Medicine

## 2016-08-09 MED ORDER — INSULIN GLARGINE 100 UNIT/ML SOLOSTAR PEN: 15.0000 [IU] | PEN_INJECTOR | Freq: Every day | SUBCUTANEOUS | 5 refills | Status: DC

## 2016-08-14 ENCOUNTER — Other Ambulatory Visit: Payer: Self-pay | Admitting: Osteopathic Medicine

## 2016-08-28 ENCOUNTER — Encounter: Payer: Self-pay | Admitting: Sports Medicine

## 2016-08-28 ENCOUNTER — Ambulatory Visit (INDEPENDENT_AMBULATORY_CARE_PROVIDER_SITE_OTHER): Payer: BLUE CROSS/BLUE SHIELD | Admitting: Sports Medicine

## 2016-08-28 DIAGNOSIS — M4802 Spinal stenosis, cervical region: Secondary | ICD-10-CM

## 2016-08-28 DIAGNOSIS — M7121 Synovial cyst of popliteal space [Baker], right knee: Secondary | ICD-10-CM | POA: Insufficient documentation

## 2016-08-28 NOTE — Assessment & Plan Note (Signed)
Aspiration and injection as above.

## 2016-08-28 NOTE — Assessment & Plan Note (Addendum)
C3-C6 degenerative disc disease with some central canal narrowing at these levels. At this point has had physical therapy, 3 epidurals, max dose gabapentin. No improvement in pain. She did develop adhesive capsulitis on the left which we treated with a glenohumeral injection which resolved this, but still has persistent neck pain. She did talk to Dr. Christella Noa with neurosurgery who was agreeable to consider a multilevel cervical ACDF, but she would like to pursue further medical management options first which I think is acceptable. I do think she is a candidate for medical pain management, she would like a second opinion for pain management from someone here. Currently goes to [facility].

## 2016-08-28 NOTE — Progress Notes (Signed)
  Subjective:    CC: Follow-up  HPI: Angel [name] returns, she has multilevel cervical degenerative disc disease, she did have a flare of adhesive Colitis which resolved after a glenohumeral injection. She has had formal physical therapy for her neck, 3 epidurals, neuropathic agents, NSAIDs, steroids. She has been seeing [facility] pain management.  I set her up with neurosurgery, a possible multilevel ACDF was planned, but the patient at this point would like to move forward with a second opinion from pain management before considering surgical intervention which I think is fully appropriate.  Past medical history:  Negative.  See flowsheet/record as well for more information.  Surgical history: Negative.  See flowsheet/record as well for more information.  Family history: Negative.  See flowsheet/record as well for more information.  Social history: Negative.  See flowsheet/record as well for more information.  Allergies, and medications have been entered into the medical record, reviewed, and no changes needed.   Review of Systems: No fevers, chills, night sweats, weight loss, chest pain, or shortness of breath.   Objective:    General: Well Developed, well nourished, and in no acute distress.  Neuro: Alert and oriented x3, extra-ocular muscles intact, sensation grossly intact.  HEENT: Normocephalic, atraumatic, pupils equal round reactive to light, neck supple, no masses, no lymphadenopathy, thyroid nonpalpable.  Skin: Warm and dry, no rashes. Cardiac: Regular rate and rhythm, no murmurs rubs or gallops, no lower extremity edema.  Respiratory: Clear to auscultation bilaterally. Not using accessory muscles, speaking in full sentences. Left Shoulder: Inspection reveals no abnormalities, atrophy or asymmetry. Palpation is normal with no tenderness over AC joint or bicipital groove. ROM is full in all planes. Rotator cuff strength normal throughout. No signs of impingement with  negative Neer and Hawkin's tests, empty can. Speeds and Yergason's tests normal. No labral pathology noted with negative Obrien's, negative crank, negative clunk, and good stability. Normal scapular function observed. No painful arc and no drop arm sign. No apprehension sign She does have some reproduction of the neck pain with moving her shoulder.  Impression and Recommendations:    Baker's cyst, right Aspiration and injection as above.  Cervical spinal stenosis C3-C6 degenerative disc disease with some central canal narrowing at these levels. At this point has had physical therapy, 3 epidurals, max dose gabapentin. No improvement in pain. She did develop adhesive capsulitis on the left which we treated with a glenohumeral injection which resolved this, but still has persistent neck pain. She did talk to Dr. Christella Noa with neurosurgery who was agreeable to consider a multilevel cervical ACDF, but she would like to pursue further medical management options first which I think is acceptable. I do think she is a candidate for medical pain management, she would like a second opinion for pain management from someone here. Currently goes to [facility].

## 2016-08-30 ENCOUNTER — Other Ambulatory Visit: Payer: Self-pay | Admitting: Osteopathic Medicine

## 2016-09-04 ENCOUNTER — Ambulatory Visit (INDEPENDENT_AMBULATORY_CARE_PROVIDER_SITE_OTHER): Payer: BLUE CROSS/BLUE SHIELD | Admitting: Osteopathic Medicine

## 2016-09-04 ENCOUNTER — Ambulatory Visit (INDEPENDENT_AMBULATORY_CARE_PROVIDER_SITE_OTHER): Payer: BLUE CROSS/BLUE SHIELD | Admitting: Sports Medicine

## 2016-09-04 ENCOUNTER — Other Ambulatory Visit (HOSPITAL_COMMUNITY)
Admission: RE | Admit: 2016-09-04 | Discharge: 2016-09-04 | Disposition: A | Discharge status: Home / Self Care | Payer: BLUE CROSS/BLUE SHIELD | Source: Ambulatory Visit | Attending: Osteopathic Medicine | Admitting: Osteopathic Medicine

## 2016-09-04 ENCOUNTER — Encounter: Payer: Self-pay | Admitting: Osteopathic Medicine

## 2016-09-04 VITALS — BP 143/68 | HR 60 | Temp 98.2°F | Resp 16 | Wt 136.0 lb

## 2016-09-04 DIAGNOSIS — R202 Paresthesia of skin: Secondary | ICD-10-CM

## 2016-09-04 DIAGNOSIS — Z794 Long term (current) use of insulin: Secondary | ICD-10-CM | POA: Insufficient documentation

## 2016-09-04 DIAGNOSIS — R200 Anesthesia of skin: Secondary | ICD-10-CM | POA: Diagnosis not present

## 2016-09-04 DIAGNOSIS — Z780 Asymptomatic menopausal state: Secondary | ICD-10-CM | POA: Diagnosis not present

## 2016-09-04 DIAGNOSIS — E1159 Type 2 diabetes mellitus with other circulatory complications: Secondary | ICD-10-CM | POA: Diagnosis not present

## 2016-09-04 DIAGNOSIS — M7502 Adhesive capsulitis of left shoulder: Secondary | ICD-10-CM

## 2016-09-04 DIAGNOSIS — M4802 Spinal stenosis, cervical region: Secondary | ICD-10-CM | POA: Diagnosis not present

## 2016-09-04 DIAGNOSIS — Z23 Encounter for immunization: Secondary | ICD-10-CM | POA: Diagnosis not present

## 2016-09-04 DIAGNOSIS — R7989 Other specified abnormal findings of blood chemistry: Secondary | ICD-10-CM

## 2016-09-04 DIAGNOSIS — Z0000 Encounter for general adult medical examination without abnormal findings: Secondary | ICD-10-CM

## 2016-09-04 LAB — CBC WITH DIFFERENTIAL/PLATELET
Basophils Absolute: 0 cells/uL (ref 0–200)
Basophils Relative: 0 %
EOS PCT: 1 %
Eosinophils Absolute: 119 cells/uL (ref 15–500)
HCT: 36.3 % (ref 35.0–45.0)
HEMOGLOBIN: 12.1 g/dL (ref 11.7–15.5)
LYMPHS ABS: 8568 {cells}/uL — AB (ref 850–3900)
LYMPHS PCT: 72 %
MCH: 30.3 pg (ref 27.0–33.0)
MCHC: 33.3 g/dL (ref 32.0–36.0)
MCV: 91 fL (ref 80.0–100.0)
MONOS PCT: 5 %
MPV: 10.3 fL (ref 7.5–12.5)
Monocytes Absolute: 595 cells/uL (ref 200–950)
NEUTROS PCT: 22 %
Neutro Abs: 2618 cells/uL (ref 1500–7800)
PLATELETS: 239 10*3/uL (ref 140–400)
RBC: 3.99 MIL/uL (ref 3.80–5.10)
RDW: 13 % (ref 11.0–15.0)
WBC: 11.9 10*3/uL — AB (ref 3.8–10.8)

## 2016-09-04 LAB — COMPLETE METABOLIC PANEL WITH GFR
ALT: 21 U/L (ref 6–29)
AST: 29 U/L (ref 10–35)
Albumin: 4.2 g/dL (ref 3.6–5.1)
Alkaline Phosphatase: 98 U/L (ref 33–130)
BUN: 11 mg/dL (ref 7–25)
CHLORIDE: 100 mmol/L (ref 98–110)
CO2: 28 mmol/L (ref 20–31)
Calcium: 9.2 mg/dL (ref 8.6–10.4)
Creat: 0.8 mg/dL (ref 0.50–1.05)
GFR, EST NON AFRICAN AMERICAN: 82 mL/min (ref >=60)
GFR, Est African American: >89 mL/min (ref >=60)
GLUCOSE: 141 mg/dL — AB (ref 65–99)
POTASSIUM: 4.7 mmol/L (ref 3.5–5.3)
SODIUM: 135 mmol/L (ref 135–146)
Total Bilirubin: 0.3 mg/dL (ref 0.2–1.2)
Total Protein: 6.6 g/dL (ref 6.1–8.1)

## 2016-09-04 LAB — LIPID PANEL
CHOL/HDL RATIO: 3.2 ratio (ref <5.0)
Cholesterol: 127 mg/dL (ref <200)
HDL: 40 mg/dL — AB (ref >50)
LDL Cholesterol: 74 mg/dL (ref <100)
Triglycerides: 63 mg/dL (ref <150)
VLDL: 13 mg/dL (ref <30)

## 2016-09-04 LAB — TSH: TSH: 0.84 m[IU]/L

## 2016-09-04 LAB — POCT GLYCOSYLATED HEMOGLOBIN (HGB A1C): Hemoglobin A1C: 8.1

## 2016-09-04 MED ORDER — SAXAGLIPTIN HCL 5 MG PO TABS: 5.0000 mg | ORAL_TABLET | Freq: Every day | ORAL | 3 refills | Status: DC

## 2016-09-04 NOTE — Progress Notes (Signed)
  Subjective:    CC: Left shoulder pain  HPI: This is a pleasant 56 year old female with a history of adhesive capsulitis and shoulder osteoarthritis, we injected her glenohumeral joint 4 months ago and she did well. Now having recurrence of stiffness and pain, desires repeat injection. She also complains of persistent neck pain with 3 patient down to the hands, with numbness and tingling more in the right hand second through fourth fingers, worse at night and in the morning. Has never had a nerve conduction study or been treated for carpal tunnel syndrome, does have multilevel cervical degenerative disc disease.  Past medical history:  Negative.  See flowsheet/record as well for more information.  Surgical history: Negative.  See flowsheet/record as well for more information.  Family history: Negative.  See flowsheet/record as well for more information.  Social history: Negative.  See flowsheet/record as well for more information.  Allergies, and medications have been entered into the medical record, reviewed, and no changes needed.   Review of Systems: No fevers, chills, night sweats, weight loss, chest pain, or shortness of breath.   Objective:    General: Well Developed, well nourished, and in no acute distress.  Neuro: Alert and oriented x3, extra-ocular muscles intact, sensation grossly intact.  HEENT: Normocephalic, atraumatic, pupils equal round reactive to light, neck supple, no masses, no lymphadenopathy, thyroid nonpalpable.  Skin: Warm and dry, no rashes. Cardiac: Regular rate and rhythm, no murmurs rubs or gallops, no lower extremity edema.  Respiratory: Clear to auscultation bilaterally. Not using accessory muscles, speaking in full sentences.  Procedure: Real-time Ultrasound Guided Injection of left glenohumeral joint Device: GE Logiq E  Verbal informed consent obtained.  Time-out conducted.  Noted no overlying erythema, induration, or other signs of local infection.  Skin  prepped in a sterile fashion.  Local anesthesia: Topical Ethyl chloride.  With sterile technique and under real time ultrasound guidance:  I took a posterior approach and guided into the joint, 1 mL Kenalog 40, 2 mL lidocaine, 2 mL bupivacaine injected easily. Completed without difficulty  Pain immediately resolved suggesting accurate placement of the medication.  Advised to call if fevers/chills, erythema, induration, drainage, or persistent bleeding.  Images permanently stored and available for review in the ultrasound unit.  Impression: Technically successful ultrasound guided injection.  Impression and Recommendations:    Numbness and tingling in both hands Symptoms are nocturnal and in the morning and I do have concern that this is related to carpal tunnel syndrome. Before we proceed with cervical ACDF I would like her to do nerve conduction and EMG. She did describe some pallor of the fingers suspicious for Raynauds disease, however the majority of her description sounds more like carpal tunnel/neuropathic so we will treat this first.  Adhesive capsulitis of left shoulder Adhesive capsulitis is back, glenohumeral injection as above.

## 2016-09-04 NOTE — Progress Notes (Signed)
HPI: Angel [name] is a 56 y.o. female  who presents to [place] today, 09/04/16,  for chief complaint of:  Chief Complaint  Patient presents with  . Annual Exam   Patient here today for annual physical exam. Please see below for review of preventive care.  Diabetes: Patient states she could be doing better as far as diet/exercise or concerns. A1c today 8.1, up a bit from previous. No pneumonia shot on file.  Shoulder pain: Following up with sports medicine later today.  Cervical radiculopathy: Patient states has seen neurosurgery and is following up with pain management in a few weeks. She is concerned about some numbness/tingling down into the arms bilaterally.   Past medical, surgical, social and family history reviewed: Patient Active Problem List   Diagnosis Date Noted  . Adhesive capsulitis of left shoulder 05/23/2016  . Cervical spinal stenosis 12/13/2015  . Diabetes mellitus type 2 in nonobese ([place]) 03/11/2013  . Essential hypertension 03/11/2013  . Glaucoma (increased eye pressure) 03/11/2013  . Menopausal and postmenopausal disorder 03/11/2013   Past Surgical History:  Procedure Laterality Date  . APPENDECTOMY    . TONSILLECTOMY AND ADENOIDECTOMY    . TUBAL LIGATION     Social History  Substance Use Topics  . Smoking status: Former Research scientist (life sciences)  . Smokeless tobacco: Never Used  . Alcohol use Not on file   Family History  Problem Relation Age of Onset  . Hypertension Mother   . Asthma Sister   . Hypertension Sister   . Diabetes Brother   . Hypertension Brother   . Cancer Maternal Grandfather      Current medication list and allergy/intolerance information reviewed:   Current Outpatient Prescriptions  Medication Sig Dispense Refill  . aspirin 81 MG chewable tablet Chew 81 mg by mouth.    . Blood Glucose Monitoring Suppl ([place]) w/Device KIT     . gabapentin (NEURONTIN) 600 MG tablet Two tabs PO BID 120 tablet  3  . Insulin Glargine (LANTUS SOLOSTAR) 100 UNIT/ML Solostar Pen Inject 15 Units into the skin daily. 3 mL 5  . latanoprost (XALATAN) 0.005 % ophthalmic solution     . losartan-hydrochlorothiazide (HYZAAR) 100-12.5 MG tablet take 1 tablet by mouth once daily    . medroxyPROGESTERone (PROVERA) 10 MG tablet take 1 tablet by mouth once daily 30 tablet 5  . saxagliptin HCl (ONGLYZA) 5 MG TABS tablet Take 1 tablet (5 mg total) by mouth daily. APPOINTMENT NEEDED FOR FURTHER REFILLS 30 tablet 0  . timolol (TIMOPTIC) 0.5 % ophthalmic solution 0.5 mLs.    Marland Kitchen tiZANidine (ZANAFLEX) 4 MG tablet take 1 to 2 tablets by mouth every evening if needed for SPASM    . traMADol (ULTRAM) 50 MG tablet Take 1 tablet (50 mg total) by mouth 2 (two) times daily. 60 tablet 3  . varenicline (CHANTIX CONTINUING MONTH PAK) 1 MG tablet Take 1 tablet (1 mg total) by mouth 2 (two) times daily. 30 tablet 3   No current facility-administered medications for this visit.    No Known Allergies    Review of Systems:  Constitutional:  No  fever, no chills, No recent illness, No unintentional weight changes. No significant fatigue.   HEENT: No  headache, no vision change  Cardiac: No  chest pain, No  pressure, No palpitations, No  Orthopnea  Respiratory:  No  shortness of breath. No  Cough  Gastrointestinal: No  abdominal pain, No  nausea, No  vomiting  Genitourinary:  No  abnormal genital bleeding, No abnormal genital discharge  Skin: No  Rash, No other wounds/concerning lesions  Hem/Onc: No  easy bruising/bleeding  Endocrine: No polyuria/polydipsia/polyphagia   Neurologic: No  weakness, No  dizziness  Psychiatric: No  concerns with depression, No  concerns with anxiety, No sleep problems, No mood problems  Exam:  BP (!) 143/68 (BP Location: Right Arm, Patient Position: Sitting, Cuff Size: Normal)   Pulse 60   Temp 98.2 F (36.8 C) (Oral)   Resp 16   Wt 136 lb (61.7 kg)   SpO2 99%   BMI 21.95 kg/m    Constitutional: VS see above. General Appearance: alert, well-developed, well-nourished, NAD  Eyes: Normal lids and conjunctive, non-icteric sclera  Ears, Nose, Mouth, Throat: MMM, Normal external inspection ears/nares/mouth/lips/gums.   Neck: No masses, trachea midline. No thyroid enlargement. No tenderness/mass appreciated. No lymphadenopathy  Respiratory: Normal respiratory effort. no wheeze, no rhonchi, no rales  Cardiovascular: S1/S2 normal, no murmur, no rub/gallop auscultated. RRR. No lower extremity edema. Pedal pulse II/IV bilaterally DP and PT.   Gastrointestinal: Nontender, no masses. No hepatomegaly, no splenomegaly. No hernia appreciated. Bowel sounds normal. Rectal exam deferred.   Musculoskeletal: Gait normal. No clubbing/cyanosis of digits.   Neurological: Normal balance/coordination. No tremor. No cranial nerve deficit on limited exam. Motor and sensation intact and symmetric. Cerebellar reflexes intact.   Skin: warm, dry, intact. No rash/ulcer. No concerning nevi or subq nodules on limited exam.    Psychiatric: Normal judgment/insight. Normal mood and affect. Oriented x3.  GYN: No lesions/ulcers to external genitalia, normal urethra, normal vaginal mucosa, physiologic discharge, cervix normal without lesions, uterus not enlarged or tender, adnexa no masses and nontender  BREAST: No rashes/skin changes, normal fibrous breast tissue, no masses or tenderness, normal nipple without discharge, normal axilla    Results for orders placed or performed in visit on 09/04/16 (from the past 72 hour(s))  POCT A1C     Status: None   Collection Time: 09/04/16  9:21 AM  Result Value Ref Range   Hemoglobin A1C 8.1       ASSESSMENT/PLAN:   Type 2 diabetes mellitus with other circulatory complication, with long-term current use of insulin (HCC) - Diet/exercise lifestyle modifications were discussed - Plan: CBC with Differential/Platelet, COMPLETE METABOLIC PANEL WITH GFR,  Lipid panel, POCT A1C, CANCELED: Hemoglobinopathy evaluation  Annual physical exam - Plan: CBC with Differential/Platelet, COMPLETE METABOLIC PANEL WITH GFR, Lipid panel, TSH, VITAMIN D 25 Hydroxy (Vit-D Deficiency, Fractures), Cytology - PAP  Postmenopausal - Plan: VITAMIN D 25 Hydroxy (Vit-D Deficiency, Fractures)  Cervical spinal stenosis - Following with neurosurgery and pain management as well as sports medicine, would discuss later today with Dr. Darene Lamer     Patient Instructions  Plan: 1. Follow-up for diabetes in 3 months to recheck A1C -don't have to be fasting 2. Plan on annual flu shots, Shingles shot at age 68, renew pneumonia vaccines age 20 3. Routine labs today 4. If pap normal, plan to repeat in 5 years 5. Mammogram - contact [facility] for scheduling annual mammogram, or you can come here, just call imaging downstairs.  6. Colonoscopy - when due     [place] Updated 09/04/16   ANNUAL SCREENING/COUNSELING  Diet/Exercise - HEALTHY HABITS DISCUSSED TO DECREASE CV RISK History  Smoking Status  . Former Smoker  Smokeless Tobacco  . Never Used  Quit smoking - <1 year ago   History  Alcohol use Not on file  None  No flowsheet data found.  Domestic violence concerns - no  HTN SCREENING - SEE VITALS  SEXUAL HEALTH  Sexually active in the past year - No  Need/want STI testing today? - no  Concerns about libido or pain with sex? - no  Plans for pregnancy? - postmenopausal   INFECTIOUS DISEASE SCREENING  HIV - needs: declined  GC/CT - does not need  HepC - DOB 1946-1965 - needs: declined  TB - does not need  DISEASE SCREENING  Lipid - needs  Osteoporosis - women age 19+ - does not need  CANCER SCREENING  Cervical - needs  Breast - needs - done last year   Colon - does not need - done about 5-7 years ago, was told at that time to return 10 years   ADULT VACCINATION  Influenza - annual vaccine recommended  Td - booster every 10  years   Zoster - option at 68, yes at 29+ - will get Shingrix at age 41   PCV13 - was not indicated  PPSV23 - was given Immunization History  Administered Date(s) Administered  . Influenza-Unspecified 05/03/2011, 03/05/2012, 03/11/2013, 05/15/2014, 02/24/2016  . Pneumococcal Polysaccharide-23 09/04/2016  . Zoster 06/05/2011   OTHER  Fall - exercise and Vit D age 19+ - does not need  Consider ASA - age 39-59 - does not need    Visit summary with medication list and pertinent instructions was printed for patient to review. All questions at time of visit were answered - patient instructed to contact office with any additional concerns. ER/RTC precautions were reviewed with the patient. Follow-up plan: Return in about 3 months (around 12/04/2016) for Diabetes follow-up, sooner if needed.

## 2016-09-04 NOTE — Assessment & Plan Note (Signed)
Adhesive capsulitis is back, glenohumeral injection as above.

## 2016-09-04 NOTE — Assessment & Plan Note (Addendum)
Symptoms are nocturnal and in the morning and I do have concern that this is related to carpal tunnel syndrome. Before we proceed with cervical ACDF I would like her to do nerve conduction and EMG. She did describe some pallor of the fingers suspicious for Raynauds disease, however the majority of her description sounds more like carpal tunnel/neuropathic so we will treat this first.

## 2016-09-04 NOTE — Patient Instructions (Addendum)
Plan: 1. Follow-up for diabetes in 3 months to recheck A1C -don't have to be fasting 2. Plan on annual flu shots, Shingles shot at age 56, renew pneumonia vaccines age 64 3. Routine labs today 4. If pap normal, plan to repeat in 5 years 5. Mammogram - contact [facility] for scheduling annual mammogram, or you can come here, just call imaging downstairs.  6. Colonoscopy - when due    Please note: Preventive care issues were addressed today per annual physical requirements and should be 100% covered under your insurance, however other medical issues were also addressed (Diabetes) and insurance may bill you separately for "problem-based visit" in addition to annual preventive care physical. Any questions or concerns about charges which may appear on your statements should be directed to your insurance company or to [facility] billing department, please contact our office with any other questions!

## 2016-09-05 LAB — VITAMIN D 25 HYDROXY (VIT D DEFICIENCY, FRACTURES): VIT D 25 HYDROXY: 15 ng/mL — AB (ref 30–100)

## 2016-09-06 LAB — CYTOLOGY - PAP
Diagnosis: NEGATIVE
HPV: NOT DETECTED

## 2016-09-07 NOTE — Addendum Note (Signed)
Addended by: Maryla Morrow on: 09/07/2016 03:52 PM   Modules accepted: Orders

## 2016-09-08 ENCOUNTER — Other Ambulatory Visit: Payer: Self-pay | Admitting: Osteopathic Medicine

## 2016-09-08 DIAGNOSIS — R7989 Other specified abnormal findings of blood chemistry: Secondary | ICD-10-CM

## 2016-09-08 DIAGNOSIS — D72829 Elevated white blood cell count, unspecified: Secondary | ICD-10-CM

## 2016-09-08 MED ORDER — VITAMIN D (ERGOCALCIFEROL) 1.25 MG (50000 UNIT) PO CAPS: 50000.0000 [IU] | ORAL_CAPSULE | ORAL | 0 refills | Status: DC

## 2016-10-02 ENCOUNTER — Other Ambulatory Visit: Payer: Self-pay | Admitting: Osteopathic Medicine

## 2016-10-02 ENCOUNTER — Encounter: Payer: BLUE CROSS/BLUE SHIELD | Admitting: Neurology

## 2016-11-02 DIAGNOSIS — Z794 Long term (current) use of insulin: Secondary | ICD-10-CM | POA: Diagnosis not present

## 2016-11-02 DIAGNOSIS — H401121 Primary open-angle glaucoma, left eye, mild stage: Secondary | ICD-10-CM | POA: Diagnosis not present

## 2016-11-02 DIAGNOSIS — H401131 Primary open-angle glaucoma, bilateral, mild stage: Secondary | ICD-10-CM | POA: Diagnosis not present

## 2016-11-02 DIAGNOSIS — Z7982 Long term (current) use of aspirin: Secondary | ICD-10-CM | POA: Diagnosis not present

## 2016-11-02 DIAGNOSIS — D72829 Elevated white blood cell count, unspecified: Secondary | ICD-10-CM | POA: Diagnosis not present

## 2016-11-02 DIAGNOSIS — H401111 Primary open-angle glaucoma, right eye, mild stage: Secondary | ICD-10-CM | POA: Diagnosis not present

## 2016-11-02 DIAGNOSIS — I10 Essential (primary) hypertension: Secondary | ICD-10-CM | POA: Diagnosis not present

## 2016-11-02 DIAGNOSIS — H409 Unspecified glaucoma: Secondary | ICD-10-CM | POA: Diagnosis not present

## 2016-11-02 DIAGNOSIS — E559 Vitamin D deficiency, unspecified: Secondary | ICD-10-CM | POA: Diagnosis not present

## 2016-11-02 DIAGNOSIS — F17210 Nicotine dependence, cigarettes, uncomplicated: Secondary | ICD-10-CM | POA: Diagnosis not present

## 2016-11-02 DIAGNOSIS — E1136 Type 2 diabetes mellitus with diabetic cataract: Secondary | ICD-10-CM | POA: Diagnosis not present

## 2016-11-02 DIAGNOSIS — Z79899 Other long term (current) drug therapy: Secondary | ICD-10-CM | POA: Diagnosis not present

## 2016-11-02 LAB — CBC WITH DIFFERENTIAL/PLATELET
Basophils Absolute: 0 cells/uL (ref 0–200)
Basophils Relative: 0 %
EOS PCT: 1 %
Eosinophils Absolute: 123 cells/uL (ref 15–500)
HCT: 34.2 % — ABNORMAL LOW (ref 35.0–45.0)
HEMOGLOBIN: 11.8 g/dL (ref 11.7–15.5)
LYMPHS ABS: 8856 {cells}/uL — AB (ref 850–3900)
Lymphocytes Relative: 72 %
MCH: 30.3 pg (ref 27.0–33.0)
MCHC: 34.5 g/dL (ref 32.0–36.0)
MCV: 87.9 fL (ref 80.0–100.0)
MONOS PCT: 6 %
MPV: 10.6 fL (ref 7.5–12.5)
Monocytes Absolute: 738 cells/uL (ref 200–950)
Neutro Abs: 2583 cells/uL (ref 1500–7800)
Neutrophils Relative %: 21 %
PLATELETS: 224 10*3/uL (ref 140–400)
RBC: 3.89 MIL/uL (ref 3.80–5.10)
RDW: 13 % (ref 11.0–15.0)
WBC: 12.3 10*3/uL — AB (ref 3.8–10.8)

## 2016-11-03 ENCOUNTER — Other Ambulatory Visit: Payer: Self-pay

## 2016-11-03 ENCOUNTER — Other Ambulatory Visit: Payer: Self-pay | Admitting: Osteopathic Medicine

## 2016-11-03 DIAGNOSIS — D72829 Elevated white blood cell count, unspecified: Secondary | ICD-10-CM

## 2016-11-03 DIAGNOSIS — D729 Disorder of white blood cells, unspecified: Secondary | ICD-10-CM

## 2016-11-03 LAB — VITAMIN D 25 HYDROXY (VIT D DEFICIENCY, FRACTURES): VIT D 25 HYDROXY: 39 ng/mL (ref 30–100)

## 2016-11-03 NOTE — Progress Notes (Signed)
Add-on labs

## 2016-11-06 DIAGNOSIS — D72829 Elevated white blood cell count, unspecified: Secondary | ICD-10-CM | POA: Diagnosis not present

## 2016-11-07 ENCOUNTER — Other Ambulatory Visit: Payer: Self-pay | Admitting: Sports Medicine

## 2016-11-07 DIAGNOSIS — M4802 Spinal stenosis, cervical region: Secondary | ICD-10-CM

## 2016-11-07 LAB — PATHOLOGIST SMEAR REVIEW

## 2016-11-08 DIAGNOSIS — D72820 Lymphocytosis (symptomatic): Secondary | ICD-10-CM | POA: Insufficient documentation

## 2016-11-08 NOTE — Progress Notes (Signed)
referral

## 2016-11-10 ENCOUNTER — Encounter: Payer: Self-pay | Admitting: Osteopathic Medicine

## 2016-11-10 ENCOUNTER — Encounter: Payer: Self-pay | Admitting: Sports Medicine

## 2016-11-10 ENCOUNTER — Ambulatory Visit (INDEPENDENT_AMBULATORY_CARE_PROVIDER_SITE_OTHER): Payer: BLUE CROSS/BLUE SHIELD | Admitting: Sports Medicine

## 2016-11-10 ENCOUNTER — Ambulatory Visit (INDEPENDENT_AMBULATORY_CARE_PROVIDER_SITE_OTHER): Payer: BLUE CROSS/BLUE SHIELD | Admitting: Osteopathic Medicine

## 2016-11-10 VITALS — BP 137/82 | HR 64 | Wt 133.0 lb

## 2016-11-10 DIAGNOSIS — D72820 Lymphocytosis (symptomatic): Secondary | ICD-10-CM | POA: Diagnosis not present

## 2016-11-10 DIAGNOSIS — M19012 Primary osteoarthritis, left shoulder: Secondary | ICD-10-CM | POA: Diagnosis not present

## 2016-11-10 DIAGNOSIS — M4802 Spinal stenosis, cervical region: Secondary | ICD-10-CM

## 2016-11-10 DIAGNOSIS — R7989 Other specified abnormal findings of blood chemistry: Secondary | ICD-10-CM

## 2016-11-10 NOTE — Assessment & Plan Note (Signed)
X-rays did show advanced glenohumeral degenerative changes. Repeat glenohumeral injection, it's been 2-1/2 months since the last shot, half dose injection today. We are going to have her address her cervical spondylosis with her neurosurgeon to see if she gets more relief before considering shoulder replacement.

## 2016-11-10 NOTE — Assessment & Plan Note (Addendum)
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 Has had physical therapy, 3 epidurals, maximum dose gabapentin. No improvement in pain. We have not yet targeted facet joints, I would like her to proceed with multilevel facet joint injections at [facility] imaging, because she has such extensive disease we may have to consider a two-stage type process. We will start with bilateral C4-C7 facet joint injections. If still no improvement, she will follow-up with [facility] for medical pain management and with her neurosurgeon, Dr. Christella Noa, to discuss ACDF.

## 2016-11-10 NOTE — Progress Notes (Signed)
HPI: Angel [name] is a 56 y.o. female  who presents to [place] today, 11/10/16,  for chief complaint of:  Chief Complaint  Patient presents with  . Follow-up    LABS     Recent abnormal white blood cell count and some concerns for abnormality on peripheral smear for lymphocytosis with abnormal appearance to lymphocytes, borderline microcytosis and hypochromic red blood cells. Patient has had some questions about the results and wanted to discuss these will she was here for sports medicine visit.  Past medical history, surgical history, social history and family history reviewed.  Patient Active Problem List   Diagnosis Date Noted  . Leukocytosis 11/08/2016  . Numbness and tingling in both hands 09/04/2016  . Primary osteoarthritis, left shoulder 05/23/2016  . Cervical spinal stenosis 12/13/2015  . Diabetes mellitus type 2 in nonobese ([place]) 03/11/2013  . Essential hypertension 03/11/2013  . Glaucoma (increased eye pressure) 03/11/2013  . Menopausal and postmenopausal disorder 03/11/2013    Current medication list and allergy/intolerance information reviewed.   Current Outpatient Prescriptions on File Prior to Visit  Medication Sig Dispense Refill  . aspirin 81 MG chewable tablet Chew 81 mg by mouth.    . Blood Glucose Monitoring Suppl ([place]) w/Device KIT     . gabapentin (NEURONTIN) 600 MG tablet take 2 tablets by mouth twice a day 120 tablet 3  . Insulin Glargine (LANTUS SOLOSTAR) 100 UNIT/ML Solostar Pen Inject 15 Units into the skin daily. 3 mL 5  . latanoprost (XALATAN) 0.005 % ophthalmic solution     . losartan-hydrochlorothiazide (HYZAAR) 100-12.5 MG tablet take 1 tablet by mouth once daily 30 tablet 6  . medroxyPROGESTERone (PROVERA) 10 MG tablet take 1 tablet by mouth once daily 30 tablet 5  . saxagliptin HCl (ONGLYZA) 5 MG TABS tablet Take 1 tablet (5 mg total) by mouth daily. 90 tablet 3  . timolol (TIMOPTIC) 0.5 %  ophthalmic solution 0.5 mLs.    Marland Kitchen tiZANidine (ZANAFLEX) 4 MG tablet take 1 to 2 tablets by mouth every evening if needed for SPASM    . traMADol (ULTRAM) 50 MG tablet Take 1 tablet (50 mg total) by mouth 2 (two) times daily. 60 tablet 3  . varenicline (CHANTIX CONTINUING MONTH PAK) 1 MG tablet Take 1 tablet (1 mg total) by mouth 2 (two) times daily. 30 tablet 3  . Vitamin D, Ergocalciferol, (DRISDOL) 50000 units CAPS capsule Take 1 capsule (50,000 Units total) by mouth every 7 (seven) days. 8 capsule 0   No current facility-administered medications on file prior to visit.    No Known Allergies    Review of Systems:  Constitutional: No recent illness, feels well today   Neurologic: No  weakness, No  Dizziness   Exam:  BP 137/82   Pulse 64   Wt 133 lb (60.3 kg)   BMI 21.47 kg/m   Constitutional: VS see above. General Appearance: alert, well-developed, well-nourished, NAD    Recent Results (from the past 2160 hour(s))  Cytology - PAP     Status: None   Collection Time: 09/04/16 12:00 AM  Result Value Ref Range   Adequacy      Satisfactory for evaluation  endocervical/transformation zone component PRESENT.   Diagnosis      NEGATIVE FOR INTRAEPITHELIAL LESIONS OR MALIGNANCY.   HPV NOT DETECTED     Comment: Normal Reference Range - NOT Detected   Material Submitted CervicoVaginal Pap [ThinPrep Imaged]   CBC with Differential/Platelet  Status: Abnormal   Collection Time: 09/04/16  9:17 AM  Result Value Ref Range   WBC 11.9 (H) 3.8 - 10.8 K/uL    Comment: Few atypical lymphocytes noted.   RBC 3.99 3.80 - 5.10 MIL/uL   Hemoglobin 12.1 11.7 - 15.5 g/dL   HCT 36.3 35.0 - 45.0 %   MCV 91.0 80.0 - 100.0 fL   MCH 30.3 27.0 - 33.0 pg   MCHC 33.3 32.0 - 36.0 g/dL   RDW 13.0 11.0 - 15.0 %   Platelets 239 140 - 400 K/uL   MPV 10.3 7.5 - 12.5 fL   Neutro Abs 2,618 1,500 - 7,800 cells/uL   Lymphs Abs 8,568 (H) 850 - 3,900 cells/uL   Monocytes Absolute 595 200 - 950 cells/uL    Eosinophils Absolute 119 15 - 500 cells/uL   Basophils Absolute 0 0 - 200 cells/uL   Neutrophils Relative % 22 %   Lymphocytes Relative 72 %   Monocytes Relative 5 %   Eosinophils Relative 1 %   Basophils Relative 0 %   Smear Review Criteria for review not met   COMPLETE METABOLIC PANEL WITH GFR     Status: Abnormal   Collection Time: 09/04/16  9:17 AM  Result Value Ref Range   Sodium 135 135 - 146 mmol/L   Potassium 4.7 3.5 - 5.3 mmol/L   Chloride 100 98 - 110 mmol/L   CO2 28 20 - 31 mmol/L   Glucose, Bld 141 (H) 65 - 99 mg/dL   BUN 11 7 - 25 mg/dL   Creat 0.80 0.50 - 1.05 mg/dL    Comment:   For patients > or = 57 years of age: The upper reference limit for Creatinine is approximately 13% higher for people identified as African-American.      Total Bilirubin 0.3 0.2 - 1.2 mg/dL   Alkaline Phosphatase 98 33 - 130 U/L   AST 29 10 - 35 U/L   ALT 21 6 - 29 U/L   Total Protein 6.6 6.1 - 8.1 g/dL   Albumin 4.2 3.6 - 5.1 g/dL   Calcium 9.2 8.6 - 10.4 mg/dL   GFR, Est African American >89 >=60 mL/min   GFR, Est Non African American 82 >=60 mL/min  Lipid panel     Status: Abnormal   Collection Time: 09/04/16  9:17 AM  Result Value Ref Range   Cholesterol 127 <200 mg/dL   Triglycerides 63 <150 mg/dL   HDL 40 (L) >50 mg/dL   Total CHOL/HDL Ratio 3.2 <5.0 Ratio   VLDL 13 <30 mg/dL   LDL Cholesterol 74 <100 mg/dL  TSH     Status: None   Collection Time: 09/04/16  9:17 AM  Result Value Ref Range   TSH 0.84 mIU/L    Comment:   Reference Range   > or = 20 Years  0.40-4.50   Pregnancy Range First trimester  0.26-2.66 Second trimester 0.55-2.73 Third trimester  0.43-2.91     VITAMIN D 25 Hydroxy (Vit-D Deficiency, Fractures)     Status: Abnormal   Collection Time: 09/04/16  9:17 AM  Result Value Ref Range   Vit D, 25-Hydroxy 15 (L) 30 - 100 ng/mL    Comment: Vitamin D Status           25-OH Vitamin D        Deficiency                <20 ng/mL        Insufficiency  20 - 29 ng/mL        Optimal             > or = 30 ng/mL   For 25-OH Vitamin D testing on patients on D2-supplementation and patients for whom quantitation of D2 and D3 fractions is required, the QuestAssureD 25-OH VIT D, (D2,D3), LC/MS/MS is recommended: order code [id number] (patients > 2 yrs).   POCT A1C     Status: None   Collection Time: 09/04/16  9:21 AM  Result Value Ref Range   Hemoglobin A1C 8.1   CBC with Differential     Status: Abnormal   Collection Time: 11/02/16  8:14 AM  Result Value Ref Range   WBC 12.3 (H) 3.8 - 10.8 K/uL    Comment: Few atypical lymphocytes noted.   RBC 3.89 3.80 - 5.10 MIL/uL   Hemoglobin 11.8 11.7 - 15.5 g/dL   HCT 34.2 (L) 35.0 - 45.0 %   MCV 87.9 80.0 - 100.0 fL   MCH 30.3 27.0 - 33.0 pg   MCHC 34.5 32.0 - 36.0 g/dL   RDW 13.0 11.0 - 15.0 %   Platelets 224 140 - 400 K/uL   MPV 10.6 7.5 - 12.5 fL   Neutro Abs 2,583 1,500 - 7,800 cells/uL   Lymphs Abs 8,856 (H) 850 - 3,900 cells/uL   Monocytes Absolute 738 200 - 950 cells/uL   Eosinophils Absolute 123 15 - 500 cells/uL   Basophils Absolute 0 0 - 200 cells/uL   Neutrophils Relative % 21 %   Lymphocytes Relative 72 %   Monocytes Relative 6 %   Eosinophils Relative 1 %   Basophils Relative 0 %   Smear Review Criteria for review not met   Vitamin D (25 hydroxy)     Status: None   Collection Time: 11/02/16  8:14 AM  Result Value Ref Range   Vit D, 25-Hydroxy 39 30 - 100 ng/mL    Comment: Vitamin D Status           25-OH Vitamin D        Deficiency                <20 ng/mL        Insufficiency         20 - 29 ng/mL        Optimal             > or = 30 ng/mL   For 25-OH Vitamin D testing on patients on D2-supplementation and patients for whom quantitation of D2 and D3 fractions is required, the QuestAssureD 25-OH VIT D, (D2,D3), LC/MS/MS is recommended: order code [id number] (patients > 2 yrs).   Pathologist smear review     Status: None   Collection Time: 11/06/16  9:51 AM  Result Value Ref  Range   Path Review SEE NOTE     Comment: Absolute lymphocytosis and atypical lymphocytes. Suggest immunophenotyping by flow cytometry if a new/persistent finding. Anemia, with RBC's which appear to be borderline microcytic and hypochromic on smear review. Suggest evaluation for iron deficiency, if clinically indicated. Platelets are unremarkable. Reviewed by Francis Gaines Mammarappallil MD (Electronic Signature on File) 11/07/16     No results found.  No flowsheet data found.  No flowsheet data found.    ASSESSMENT/PLAN: The primary encounter diagnosis was Abnormal CBC measurement. A diagnosis of Lymphocytosis was also pertinent to this visit.  Discuss results in detail with the patient and explained recommendation for follow-up with hematology  for further testing. All questions answered, patient feels better after consultation. Advised to call hematologist office, number given on visit summary, if she has not heard back about referral next few visits days  Patient [place] [facility] Address: [address], [place], [place] [address] Phone: [phone]    Follow-up plan: Return for diabetes recheck mid-late July with Dr A, sooner if needed.  Visit summary with medication list and pertinent instructions was printed for patient to review, alert [place] if any changes needed. All questions at time of visit were answered - patient instructed to contact office with any additional concerns. ER/RTC precautions were reviewed with the patient and understanding verbalized.   Note: Total time spent 15 minutes, greater than 50% of the visit was spent face-to-face counseling and coordinating care for the following: The primary encounter diagnosis was Abnormal CBC measurement. A diagnosis of Lymphocytosis was also pertinent to this visit.Marland Kitchen

## 2016-11-10 NOTE — Progress Notes (Addendum)
  Subjective:    CC: Left shoulder pain  HPI: Left shoulder pain: Aamori returns, she has advanced degenerative changes of her left glenohumeral joint that responded moderately well to steroid injections, the last one was 2-1/2 months ago. She is back with recurrence of the same pain. Desires repeat interventional treatment today. Moderate, persistent.  Cervical degenerative disc disease: Patient will get back in touch with neurosurgery to discuss ACDF.  Past medical history:  Negative.  See flowsheet/record as well for more information.  Surgical history: Negative.  See flowsheet/record as well for more information.  Family history: Negative.  See flowsheet/record as well for more information.  Social history: Negative.  See flowsheet/record as well for more information.  Allergies, and medications have been entered into the medical record, reviewed, and no changes needed.   Review of Systems: No fevers, chills, night sweats, weight loss, chest pain, or shortness of breath.   Objective:    General: Well Developed, well nourished, and in no acute distress.  Neuro: Alert and oriented x3, extra-ocular muscles intact, sensation grossly intact.  HEENT: Normocephalic, atraumatic, pupils equal round reactive to light, neck supple, no masses, no lymphadenopathy, thyroid nonpalpable.  Skin: Warm and dry, no rashes. Cardiac: Regular rate and rhythm, no murmurs rubs or gallops, no lower extremity edema.  Respiratory: Clear to auscultation bilaterally. Not using accessory muscles, speaking in full sentences.  Procedure: Real-time Ultrasound Guided Injection of left glenohumeral joint Device: GE Logiq E  Verbal informed consent obtained.  Time-out conducted.  Noted no overlying erythema, induration, or other signs of local infection.  Skin prepped in a sterile fashion.  Local anesthesia: Topical Ethyl chloride.  With sterile technique and under real time ultrasound guidance:  1/2 mL Kenalog 40, 2  mL lidocaine, 2 mL bupivacaine injected easily Completed without difficulty  Pain immediately resolved suggesting accurate placement of the medication.  Advised to call if fevers/chills, erythema, induration, drainage, or persistent bleeding.  Images permanently stored and available for review in the ultrasound unit.  Impression: Technically successful ultrasound guided injection.  Impression and Recommendations:    Primary osteoarthritis, left shoulder X-rays did show advanced glenohumeral degenerative changes. Repeat glenohumeral injection, it's been 2-1/2 months since the last shot, half dose injection today. We are going to have her address her cervical spondylosis with her neurosurgeon to see if she gets more relief before considering shoulder replacement.  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 Has had physical therapy, 3 epidurals, maximum dose gabapentin. No improvement in pain. We have not yet targeted facet joints, I would like her to proceed with multilevel facet joint injections at [facility] imaging, because she has such extensive disease we may have to consider a two-stage type process. We will start with bilateral C4-C7 facet joint injections. If still no improvement, she will follow-up with [facility] for medical pain management and with her neurosurgeon, Dr. Christella Noa, to discuss ACDF.

## 2016-11-10 NOTE — Addendum Note (Signed)
Addended by: Silverio Decamp on: 11/10/2016 09:00 AM   Modules accepted: Orders

## 2016-11-10 NOTE — Patient Instructions (Signed)
[place] at [facility] Address: [address], [place], [place] [address] Phone: [phone]

## 2016-11-17 ENCOUNTER — Ambulatory Visit
Admission: RE | Admit: 2016-11-17 | Discharge: 2016-11-17 | Disposition: A | Discharge status: Home / Self Care | Payer: BLUE CROSS/BLUE SHIELD | Source: Ambulatory Visit | Attending: Sports Medicine | Admitting: Sports Medicine

## 2016-11-17 ENCOUNTER — Other Ambulatory Visit: Payer: Self-pay | Admitting: Sports Medicine

## 2016-11-17 ENCOUNTER — Ambulatory Visit: Admission: RE | Admit: 2016-11-17 | Payer: BLUE CROSS/BLUE SHIELD | Source: Ambulatory Visit

## 2016-11-17 DIAGNOSIS — M4802 Spinal stenosis, cervical region: Secondary | ICD-10-CM

## 2016-11-17 DIAGNOSIS — M542 Cervicalgia: Secondary | ICD-10-CM | POA: Diagnosis not present

## 2016-11-17 MED ORDER — DEXAMETHASONE SODIUM PHOSPHATE 4 MG/ML IJ SOLN
6.0000 mg | Freq: Once | INTRAMUSCULAR | Status: AC
  Administered 2016-11-17: 6 mg via INTRA_ARTICULAR

## 2016-11-17 MED ORDER — IOPAMIDOL (ISOVUE-M 300) INJECTION 61%
1.0000 mL | Freq: Once | INTRAMUSCULAR | Status: AC | PRN
  Administered 2016-11-17: 1 mL via INTRA_ARTICULAR

## 2016-11-17 NOTE — Discharge Instructions (Signed)

## 2016-11-20 ENCOUNTER — Other Ambulatory Visit: Payer: Self-pay | Admitting: Family

## 2016-11-20 DIAGNOSIS — D72820 Lymphocytosis (symptomatic): Secondary | ICD-10-CM

## 2016-11-21 ENCOUNTER — Other Ambulatory Visit (HOSPITAL_BASED_OUTPATIENT_CLINIC_OR_DEPARTMENT_OTHER): Payer: BLUE CROSS/BLUE SHIELD

## 2016-11-21 ENCOUNTER — Ambulatory Visit (HOSPITAL_BASED_OUTPATIENT_CLINIC_OR_DEPARTMENT_OTHER): Payer: BLUE CROSS/BLUE SHIELD | Admitting: Family

## 2016-11-21 ENCOUNTER — Other Ambulatory Visit (HOSPITAL_COMMUNITY)
Admission: RE | Admit: 2016-11-21 | Discharge: 2016-11-21 | Disposition: A | Discharge status: Home / Self Care | Payer: BLUE CROSS/BLUE SHIELD | Source: Ambulatory Visit | Attending: Hematology & Oncology | Admitting: Hematology & Oncology

## 2016-11-21 ENCOUNTER — Ambulatory Visit: Payer: BLUE CROSS/BLUE SHIELD

## 2016-11-21 VITALS — BP 113/68 | HR 60 | Temp 98.4°F | Resp 16 | Ht 64.0 in | Wt 133.0 lb

## 2016-11-21 DIAGNOSIS — D72820 Lymphocytosis (symptomatic): Secondary | ICD-10-CM

## 2016-11-21 DIAGNOSIS — D72829 Elevated white blood cell count, unspecified: Secondary | ICD-10-CM

## 2016-11-21 DIAGNOSIS — C9110 Chronic lymphocytic leukemia of B-cell type not having achieved remission: Secondary | ICD-10-CM | POA: Diagnosis not present

## 2016-11-21 DIAGNOSIS — Z720 Tobacco use: Secondary | ICD-10-CM

## 2016-11-21 DIAGNOSIS — C9190 Lymphoid leukemia, unspecified not having achieved remission: Secondary | ICD-10-CM | POA: Diagnosis not present

## 2016-11-21 LAB — CBC WITH DIFFERENTIAL (CANCER CENTER ONLY)
BASO#: 0 10*3/uL (ref 0.0–0.2)
BASO%: 0.2 % (ref 0.0–2.0)
EOS%: 1.1 % (ref 0.0–7.0)
Eosinophils Absolute: 0.1 10*3/uL (ref 0.0–0.5)
HCT: 34.7 % — ABNORMAL LOW (ref 34.8–46.6)
HEMOGLOBIN: 11.7 g/dL (ref 11.6–15.9)
LYMPH#: 8.4 10*3/uL — ABNORMAL HIGH (ref 0.9–3.3)
LYMPH%: 69.5 % — AB (ref 14.0–48.0)
MCH: 30.9 pg (ref 26.0–34.0)
MCHC: 33.7 g/dL (ref 32.0–36.0)
MCV: 92 fL (ref 81–101)
MONO#: 0.7 10*3/uL (ref 0.1–0.9)
MONO%: 5.8 % (ref 0.0–13.0)
NEUT%: 23.4 % — ABNORMAL LOW (ref 39.6–80.0)
NEUTROS ABS: 2.8 10*3/uL (ref 1.5–6.5)
PLATELETS: 204 10*3/uL (ref 145–400)
RBC: 3.79 10*6/uL (ref 3.70–5.32)
RDW: 12.3 % (ref 11.1–15.7)
WBC: 12.1 10*3/uL — ABNORMAL HIGH (ref 3.9–10.0)

## 2016-11-21 LAB — COMPREHENSIVE METABOLIC PANEL
ALT: 28 U/L (ref 0–55)
AST: 29 U/L (ref 5–34)
Albumin: 4 g/dL (ref 3.5–5.0)
Alkaline Phosphatase: 83 U/L (ref 40–150)
Anion Gap: 10 mEq/L (ref 3–11)
BUN: 15.5 mg/dL (ref 7.0–26.0)
CHLORIDE: 104 meq/L (ref 98–109)
CO2: 27 meq/L (ref 22–29)
Calcium: 9.5 mg/dL (ref 8.4–10.4)
Creatinine: 1.3 mg/dL — ABNORMAL HIGH (ref 0.6–1.1)
EGFR: 55 mL/min/{1.73_m2} — ABNORMAL LOW (ref >90)
Glucose: 119 mg/dl (ref 70–140)
POTASSIUM: 5.1 meq/L (ref 3.5–5.1)
SODIUM: 141 meq/L (ref 136–145)
Total Bilirubin: 0.28 mg/dL (ref 0.20–1.20)
Total Protein: 6.4 g/dL (ref 6.4–8.3)

## 2016-11-21 LAB — CHCC SATELLITE - SMEAR

## 2016-11-21 LAB — LACTATE DEHYDROGENASE: LDH: 215 U/L (ref 125–245)

## 2016-11-21 NOTE — Progress Notes (Signed)
Hematology/Oncology Consultation   Name: Angel [name]      MRN: [id number]    Location: Room/bed info not found  Date: 11/21/2016 Time:11:31 AM   REFERRING PHYSICIAN: Emeterio Reeve, DO  REASON FOR CONSULT: Leukocytosis    DIAGNOSIS:    ICD-10-CM   1. 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 She states that she has not had her lab work drawn in several years. Her WBC count in January 2012 was 10.1. She feels that this was likely her last lab draw until recently.  She has no c/o fatigue.   She denies having had any issue with infections. No fever, chills, n/v, cough, rash, dizziness, SOB, chest pain, palpitations, abdominal pain or changes in bowel or bladder habits.  No history of sickle cell disease or trait.  Family history of cancer includes father with lung cancer (smoker) and maternal grandfather with an unknown primary.  She has occasional numbness and tingling in her fingertips.  She has arthritis in her neck and received her first steroid injection last week. She received 3 rounds of epidural injections a couple years ago. No swelling or tenderness in her extremities.  She is a diabetic and states that this is fairly well controlled. Her most recent Hgb A1c was 8.1.  She has maintained a good appetite and is staying well hydrated. She denies any recent significant weight loss or gain.  She states that she smokes occasionally but is using Chantix at this time. She does not drink alcoholic beverages.  She states that her last colonoscopy was 7 years ago and was negative. She states that she is due now for her mammogram and schedule.  She states that she is on Provera and no longer has a cycle or spots. She has occasional hot flashes but this is not often.  She works as an Patent attorney.   ROS: All other 10  point review of systems is negative.   PAST MEDICAL HISTORY:   Past Medical History:  Diagnosis Date  . Diabetes mellitus without complication ([place])   . Glaucoma   . History of bone density study   . Hypertension     ALLERGIES: No Known Allergies    MEDICATIONS:  Current Outpatient Prescriptions on File Prior to Visit  Medication Sig Dispense Refill  . aspirin 81 MG chewable tablet Chew 81 mg by mouth.    . Blood Glucose Monitoring Suppl ([place]) w/Device KIT     . gabapentin (NEURONTIN) 600 MG tablet take 2 tablets by mouth twice a day 120 tablet 3  . Insulin Glargine (LANTUS SOLOSTAR) 100 UNIT/ML Solostar Pen Inject 15 Units into the skin daily. 3 mL 5  . latanoprost (XALATAN) 0.005 % ophthalmic solution     . losartan-hydrochlorothiazide (HYZAAR) 100-12.5 MG tablet take 1 tablet by mouth once daily 30 tablet 6  . medroxyPROGESTERone (PROVERA) 10 MG tablet take 1 tablet by mouth once daily 30 tablet 5  . saxagliptin HCl (ONGLYZA) 5 MG TABS tablet Take 1 tablet (5 mg total) by mouth daily. 90 tablet 3  . timolol (TIMOPTIC) 0.5 % ophthalmic solution 0.5 mLs.    Marland Kitchen tiZANidine (ZANAFLEX) 4 MG tablet take 1 to 2 tablets by mouth every evening if needed for SPASM    . traMADol (ULTRAM) 50 MG tablet Take 1 tablet (50 mg total) by mouth  2 (two) times daily. 60 tablet 3  . varenicline (CHANTIX CONTINUING MONTH PAK) 1 MG tablet Take 1 tablet (1 mg total) by mouth 2 (two) times daily. 30 tablet 3  . Vitamin D, Ergocalciferol, (DRISDOL) 50000 units CAPS capsule Take 1 capsule (50,000 Units total) by mouth every 7 (seven) days. 8 capsule 0   No current facility-administered medications on file prior to visit.      PAST SURGICAL HISTORY Past Surgical History:  Procedure Laterality Date  . APPENDECTOMY    . TONSILLECTOMY AND ADENOIDECTOMY    . TUBAL LIGATION      FAMILY HISTORY: Family History  Problem Relation Age of Onset  . Hypertension Mother   . Asthma Sister   .  Hypertension Sister   . Diabetes Brother   . Hypertension Brother   . Cancer Maternal Grandfather     SOCIAL HISTORY:  reports that she has quit smoking. She has never used smokeless tobacco. Her alcohol and drug histories are not on file.  PERFORMANCE STATUS: The patient's performance status is 0 - Asymptomatic  PHYSICAL EXAM: Most Recent Vital Signs: Blood pressure 113/68, pulse 60, temperature 98.4 F (36.9 C), temperature source Oral, resp. rate 16, height 5' 4"  (1.626 m), weight 133 lb (60.3 kg), SpO2 100 %. BP 113/68 (BP Location: Left Arm, Patient Position: Sitting)   Pulse 60   Temp 98.4 F (36.9 C) (Oral)   Resp 16   Ht 5' 4"  (1.626 m)   Wt 133 lb (60.3 kg)   SpO2 100%   BMI 22.83 kg/m   General Appearance:    Alert, cooperative, no distress, appears stated age  Head:    Normocephalic, without obvious abnormality, atraumatic  Eyes:    PERRL, conjunctiva/corneas clear, EOM's intact, fundi    benign, both eyes        Throat:   Lips, mucosa, and tongue normal; teeth and gums normal  Neck:   Supple, symmetrical, trachea midline, no adenopathy;    thyroid:  no enlargement/tenderness/nodules; no carotid   bruit or JVD  Back:     Symmetric, no curvature, ROM normal, no CVA tenderness  Lungs:     Clear to auscultation bilaterally, respirations unlabored  Chest Wall:    No tenderness or deformity   Heart:    Regular rate and rhythm, S1 and S2 normal, no murmur, rub   or gallop     Abdomen:     Soft, non-tender, bowel sounds active all four quadrants,    no masses, no organomegaly        Extremities:   Extremities normal, atraumatic, no cyanosis or edema  Pulses:   2+ and symmetric all extremities  Skin:   Skin color, texture, turgor normal, no rashes or lesions  Lymph nodes:   Cervical, supraclavicular, and axillary nodes normal  Neurologic:   CNII-XII intact, normal strength, sensation and reflexes    throughout    LABORATORY DATA:  Results for orders placed or  performed in visit on 11/21/16 (from the past 48 hour(s))  CBC w/Diff     Status: Abnormal   Collection Time: 11/21/16  9:17 AM  Result Value Ref Range   WBC 12.1 (H) 3.9 - 10.0 10e3/uL   RBC 3.79 3.70 - 5.32 10e6/uL   HGB 11.7 11.6 - 15.9 g/dL   HCT 34.7 (L) 34.8 - 46.6 %   MCV 92 81 - 101 fL   MCH 30.9 26.0 - 34.0 pg   MCHC 33.7 32.0 - 36.0 g/dL  RDW 12.3 11.1 - 15.7 %   Platelets 204 145 - 400 10e3/uL   NEUT# 2.8 1.5 - 6.5 10e3/uL   LYMPH# 8.4 (H) 0.9 - 3.3 10e3/uL   MONO# 0.7 0.1 - 0.9 10e3/uL   Eosinophils Absolute 0.1 0.0 - 0.5 10e3/uL   BASO# 0.0 0.0 - 0.2 10e3/uL   NEUT% 23.4 (L) 39.6 - 80.0 %   LYMPH% 69.5 (H) 14.0 - 48.0 %   MONO% 5.8 0.0 - 13.0 %   EOS% 1.1 0.0 - 7.0 %   BASO% 0.2 0.0 - 2.0 %  CMP     Status: Abnormal   Collection Time: 11/21/16  9:17 AM  Result Value Ref Range   Sodium 141 136 - 145 mEq/L   Potassium 5.1 3.5 - 5.1 mEq/L   Chloride 104 98 - 109 mEq/L   CO2 27 22 - 29 mEq/L   Glucose 119 70 - 140 mg/dl    Comment: Glucose reference range is for nonfasting patients. Fasting glucose reference range is 70- 100.   BUN 15.5 7.0 - 26.0 mg/dL   Creatinine 1.3 (H) 0.6 - 1.1 mg/dL   Total Bilirubin 0.28 0.20 - 1.20 mg/dL   Alkaline Phosphatase 83 40 - 150 U/L   AST 29 5 - 34 U/L   ALT 28 0 - 55 U/L   Total Protein 6.4 6.4 - 8.3 g/dL   Albumin 4.0 3.5 - 5.0 g/dL   Calcium 9.5 8.4 - 10.4 mg/dL   Anion Gap 10 3 - 11 mEq/L   EGFR 55 (L) >90 ml/min/1.73 m2    Comment: eGFR is calculated using the CKD-EPI Creatinine Equation (2009)  LDH     Status: None   Collection Time: 11/21/16  9:17 AM  Result Value Ref Range   LDH 215 125 - 245 U/L  Smear     Status: None   Collection Time: 11/21/16  9:17 AM  Result Value Ref Range   Smear Result Smear Available       RADIOGRAPHY: No results found.     PATHOLOGY: None  ASSESSMENT/PLAN: Angel [name] is a very pleasant 56 yo [place] American female with a several month history of leukocytosis. WBC count today  is 12.1 with a lymphocyte % of 69.5. She is asymptomatic at this time and has no complaints.  Smear indicates early diagnosis of CLL. We have ordered flow cytometry to confirm. Results are pending.  We will follow along with her and plan to see her back in another 4 months for repeat lab work and follow-up.   All questions were answered and patient was given handout on CLL. She will contact our office with any questions or concerns. We can certainly see her much sooner if necessary.  She was discussed with and also seen by Dr. Marin Olp and he is in agreement with the aforementioned.   [facility]     Addendum:  I saw and examined the patient with Tyrika Newman. I will get her blood under the microscope. It certainly looks like this is CLL. She has smudge cells. The lymphocytes appeared mature.  Shockingly, her flow cytometry came back normal area and I'm surprised by this.  I think that we will have to follow her along. I still feel that this is some kind of lymphoproliferative process.  We spent about 40 minutes with her. She looks to be in great shape. She has a very strong faith. I'm glad that we were able to talk with her. We reassured her.  We will plan to get her back in 4 months.  Lattie Haw, MD

## 2016-11-21 NOTE — Patient Instructions (Signed)
Chronic Lymphocytic Leukemia Chronic lymphocytic leukemia (CLL) is a type of cancer of the blood cells and soft tissue inside bones (bone marrow). CLL happens when your bone marrow makes too many abnormal white blood cells. The cells, called leukemia cells, do not function normally and accumulate in the blood. Eventually they crowd out other healthy blood cells. CLL usually gets worse slowly. It can cause complications in your organs, such as in your spleen. It can also weaken your immune system and lead to conditions in which your immune system attacks your body (autoimmune conditions). What are the causes? The cause of this condition is not known. What increases the risk? You are more likely to develop this condition if:  You are older than 50 years.  You are white.  You are female.  You have a family history of CLL or other cancers of the lymph system.  You are of Russian Jewish or Eastern European Jewish descent.  You have been exposed to certain chemicals, such as: ? Insecticides. ? Herbicides. These include Agent Orange, a herbicide used in the [place] war.  What are the signs or symptoms? At first, there may be no symptoms. After a while, symptoms may include:  Feeling more tired than usual, even after rest.  Unplanned weight loss.  Heavy sweating at night.  Fever.  Shortness of breath.  Paleness.  Painless, swollen lymph nodes.  A feeling of fullness in the upper left part of the abdomen.  Easy bruising or bleeding.  Frequent infections.  How is this diagnosed? This condition is diagnosed based on:  A physical exam to check for an enlarged spleen, liver, or lymph nodes.  Blood and bone marrow tests to check for leukemia cells. Tests may include: ? A complete blood count. ? Flow cytometry. This method uses light sensors and dyes to figure out the number of cells as well as their size, structure, and general health. ? Immunophenotyping. This method is used to  diagnose leukemia by identifying specific antibodies found in white blood cells. The test is used when a complete blood count shows the presence of immature cells or a high number of white blood cells. ? Fluorescence in situ hybridization (FISH). This test is used to examine defects in chromosomes and how those defects affect the functioning of the cell. Results from a FISH test will be used to determine treatment and assess the outcome of that treatment.  A CT scan to check for swelling or anything abnormal in your spleen, liver, and lymph nodes.  How is this treated? Treatment for this condition depends on the stage of the leukemia and whether you have symptoms. Treatment may include:  Observation.  Targeted drugs. These are medicines that interfere with the way leukemia cells grow and multiply. They identify and attack specific leukemia cells without harming normal cells.  Chemotherapy drugs. These are medicines that kill leukemia cells that are multiplying quickly.  Radiation.  Surgery to remove the spleen.  Biological therapy (immunotherapy). This treatment boosts the ability of your immune system to fight the leukemia cells.  Bone marrow or peripheral blood stem cell transplant. This treatment replaces your own bone marrow or stem cells with bone marrow or stem cells from a donor. This treatment may be done after you receive very high doses of chemotherapy or radiation that kill your stem cells and bone marrow.  New treatments through clinical trials.  Additional medicines may be needed to help manage symptoms. Follow these instructions at home: Medicines  Take   over-the-counter and prescription medicines only as told by your health care provider.  If you were prescribed an antibiotic medicine, take it as told by your health care provider. Do not stop taking the antibiotic even if you start to feel better. If you are on chemotherapy:  Wash your hands often, especially before  meals, after being outside, and after using the toilet. Have visitors do the same.  Keep your teeth and gums clean and well cared for. Use soft toothbrushes.  Protect your skin from the sun by using sunscreen and wearing protective clothing. General instructions  Avoid contact sports or other rough activities. Ask your health care provider what activities are safe for you.  Avoid crowded places and people who are sick.  Tell your cancer care team if you develop side effects. They may be able to recommend ways to relieve them.  Try to eat regular, healthy meals. Some of your treatments might affect your appetite.  Find healthy ways of coping with stress, such as by doing yoga or meditation or by joining a support group.  Keep all follow-up visits as told by your health care provider. This is important. Where to find more information:  American Cancer Society: www.cancer.org  Leukemia and Lymphoma Society: www.lls.org  National Cancer Institute (NCI): www.cancer.gov Contact a health care provider if:  You have pain in your abdomen.  You develop new bruises that are getting bigger.  You have painful or more swollen lymph nodes.  You develop bleeding from your gums or nose.  You cannot eat or drink without vomiting.  You feel lightheaded. Get help right away if:  You have a fever or chills.  You develop chest pain.  You have trouble breathing or feel short of breath.  You faint.  There is blood in your urine or stool.  You have excessive bleeding.  You have any symptoms that are severe or uncontrolled. Summary  Chronic lymphocytic leukemia (CLL) is a type of cancer of the blood cells and bone marrow.  This condition can cause an enlarged spleen, swollen lymph nodes, a weakened immune system, low red blood cell and platelets counts, and autoimmune conditions.  Treatment for this condition depends on the stage of the cancer and whether you have  symptoms.  Chemotherapy, radiation, surgery to remove the spleen, and bone marrow transplant are some of the ways to treat CLL. This information is not intended to replace advice given to you by your health care provider. Make sure you discuss any questions you have with your health care provider. Document Released: 10/01/2008 Document Revised: 04/26/2016 Document Reviewed: 04/26/2016 Elsevier Interactive Patient Education  2017 [employer]  

## 2016-11-24 ENCOUNTER — Other Ambulatory Visit: Payer: Self-pay | Admitting: Family

## 2016-11-24 DIAGNOSIS — D508 Other iron deficiency anemias: Secondary | ICD-10-CM

## 2016-11-24 DIAGNOSIS — D72829 Elevated white blood cell count, unspecified: Secondary | ICD-10-CM

## 2016-11-27 ENCOUNTER — Telehealth: Payer: Self-pay | Admitting: Family

## 2016-11-27 ENCOUNTER — Other Ambulatory Visit: Payer: Self-pay | Admitting: Sports Medicine

## 2016-11-27 NOTE — Telephone Encounter (Signed)
I spoke with Ms. Angel [name] and let her know that her flow cytometry showed no evidence of malignancy at this time. All questions were answered and we will plan to see her back in 4 months for repeat lab work and follow-up. She is in agreement with the plan and will contact our office with any other questions or concerns.

## 2016-12-04 ENCOUNTER — Other Ambulatory Visit: Payer: Self-pay | Admitting: Sports Medicine

## 2016-12-04 ENCOUNTER — Ambulatory Visit
Admission: RE | Admit: 2016-12-04 | Discharge: 2016-12-04 | Disposition: A | Discharge status: Home / Self Care | Payer: BLUE CROSS/BLUE SHIELD | Source: Ambulatory Visit | Attending: Sports Medicine | Admitting: Sports Medicine

## 2016-12-04 DIAGNOSIS — M542 Cervicalgia: Secondary | ICD-10-CM

## 2016-12-04 DIAGNOSIS — M4692 Unspecified inflammatory spondylopathy, cervical region: Secondary | ICD-10-CM | POA: Diagnosis not present

## 2016-12-04 LAB — FLOW CYTOMETRY - CHCC SATELLITE

## 2016-12-04 MED ORDER — IOPAMIDOL (ISOVUE-M 300) INJECTION 61%
1.0000 mL | Freq: Once | INTRAMUSCULAR | Status: AC | PRN
  Administered 2016-12-04: 1 mL via INTRA_ARTICULAR

## 2016-12-04 MED ORDER — DEXAMETHASONE SODIUM PHOSPHATE 4 MG/ML IJ SOLN
6.0000 mg | Freq: Once | INTRAMUSCULAR | Status: AC
  Administered 2016-12-04: 6 mg via INTRA_ARTICULAR

## 2016-12-13 ENCOUNTER — Ambulatory Visit (INDEPENDENT_AMBULATORY_CARE_PROVIDER_SITE_OTHER): Payer: BLUE CROSS/BLUE SHIELD | Admitting: Osteopathic Medicine

## 2016-12-13 ENCOUNTER — Encounter: Payer: Self-pay | Admitting: Osteopathic Medicine

## 2016-12-13 VITALS — BP 118/61 | HR 64 | Wt 132.0 lb

## 2016-12-13 DIAGNOSIS — R82998 Other abnormal findings in urine: Secondary | ICD-10-CM

## 2016-12-13 DIAGNOSIS — R8299 Other abnormal findings in urine: Secondary | ICD-10-CM | POA: Diagnosis not present

## 2016-12-13 DIAGNOSIS — Z23 Encounter for immunization: Secondary | ICD-10-CM

## 2016-12-13 DIAGNOSIS — R350 Frequency of micturition: Secondary | ICD-10-CM

## 2016-12-13 DIAGNOSIS — E119 Type 2 diabetes mellitus without complications: Secondary | ICD-10-CM

## 2016-12-13 LAB — POCT GLYCOSYLATED HEMOGLOBIN (HGB A1C): HEMOGLOBIN A1C: 7.6

## 2016-12-13 NOTE — Progress Notes (Addendum)
HPI: Angel [name] is a 56 y.o. female  who presents to [name] today, 12/13/16,  for chief complaint of:  Chief Complaint  Patient presents with  . Diabetes    DIABETES SCREENING/PREVENTIVE CARE: A1C past 3-6 mos: Yes  controlled? Yes   05/08/16: 7.9%  09/04/16: 8.1%  12/13/16: 7.6% on Lantus 15 daily, saxagliptin 5 daily BP goal <130/80: Yes  LDL goal <70: close! 23 Eye exam annually: none on file, importance discussed with patient - will try to find in [name] (from [name])  Foot exam: Yes 12/13/16  Microalbuminuria: n/a Metformin: No - intolerant ACE/ARB: Yes  Antiplatelet if ASCVD Risk >10%: Yes  Statin: declined  Pneumovax: Yes 09/04/16  Urine frequency: nocturia, no urgency, no dysuria/hematuria     Past medical history, surgical history, social history and family history reviewed.  Patient Active Problem List   Diagnosis Date Noted  . Lymphocytosis 11/08/2016  . Numbness and tingling in both hands 09/04/2016  . Primary osteoarthritis, left shoulder 05/23/2016  . Cervical spinal stenosis 12/13/2015  . Acne vulgaris 08/03/2014  . Diabetes mellitus type 2 in nonobese ([name]) 03/11/2013  . Essential hypertension 03/11/2013  . Glaucoma (increased eye pressure) 03/11/2013  . Menopausal and postmenopausal disorder 03/11/2013  . IDDM (insulin dependent diabetes mellitus) ([name]) 02/23/2012  . NS (nuclear sclerosis) 02/23/2012  . Primary open angle glaucoma 02/23/2012    Current medication list and allergy/intolerance information reviewed.   Current Outpatient Prescriptions on File Prior to Visit  Medication Sig Dispense Refill  . aspirin 81 MG chewable tablet Chew 81 mg by mouth.    . Blood Glucose Monitoring Suppl ([place]) w/Device KIT     . Cholecalciferol (VITAMIN D3) 2000 units TABS Take by mouth.    . gabapentin (NEURONTIN) 600 MG tablet take 2 tablets by mouth twice a day 120 tablet 3  . Insulin  Glargine (LANTUS SOLOSTAR) 100 UNIT/ML Solostar Pen Inject 15 Units into the skin daily. 3 mL 5  . latanoprost (XALATAN) 0.005 % ophthalmic solution     . losartan-hydrochlorothiazide (HYZAAR) 100-12.5 MG tablet take 1 tablet by mouth once daily 30 tablet 6  . medroxyPROGESTERone (PROVERA) 10 MG tablet take 1 tablet by mouth once daily 30 tablet 5  . saxagliptin HCl (ONGLYZA) 5 MG TABS tablet Take 1 tablet (5 mg total) by mouth daily. 90 tablet 3  . timolol (TIMOPTIC) 0.5 % ophthalmic solution 0.5 mLs.    Marland Kitchen tiZANidine (ZANAFLEX) 4 MG tablet take 1 to 2 tablets by mouth every evening if needed for SPASM    . traMADol (ULTRAM) 50 MG tablet take 1 tablet by mouth twice a day 60 tablet 3  . varenicline (CHANTIX CONTINUING MONTH PAK) 1 MG tablet Take 1 tablet (1 mg total) by mouth 2 (two) times daily. 30 tablet 3   No current facility-administered medications on file prior to visit.    No Known Allergies    Review of Systems:  Constitutional: No recent illness  Cardiac: No  chest pain, No  pressure, No palpitations  Respiratory:  No  shortness of breath. No  Cough  Musculoskeletal: No new myalgia/arthralgia  GU: Urinary frequency w/o urgency, dysuria. (+)nocturia x2-3 or even 4 per night  Hem/Onc: No  easy bruising/bleeding, No  abnormal lumps/bumps  Neurologic: No  weakness, No  Dizziness  Psychiatric: No  concerns with depression, No  concerns with anxiety  Exam:  BP 118/61   Pulse 64   Wt 132 lb (  59.9 kg)   SpO2 99%   BMI 22.66 kg/m   Constitutional: VS see above. General Appearance: alert, well-developed, well-nourished, NAD  Eyes: Normal lids and conjunctive, non-icteric sclera  Ears, Nose, Mouth, Throat: MMM, Normal external inspection ears/nares/mouth/lips/gums.  Neck: No masses, trachea midline.   Respiratory: Normal respiratory effort. no wheeze, no rhonchi, no rales  Cardiovascular: S1/S2 normal, no murmur, no rub/gallop auscultated. RRR.   Musculoskeletal:  Gait normal. Symmetric and independent movement of all extremities  Neurological: Normal balance/coordination. No tremor.  Skin: warm, dry, intact.   Psychiatric: Normal judgment/insight. Normal mood and affect. Oriented x3.    Results for orders placed or performed in visit on 12/13/16 (from the past 24 hour(s))  POCT glycosylated hemoglobin (Hb A1C)     Status: None   Collection Time: 12/13/16  8:14 AM  Result Value Ref Range   Hemoglobin A1C 7.6       ASSESSMENT/PLAN:   Diabetes mellitus type 2 in nonobese ([name]) - Doing well!  - Plan: POCT glycosylated hemoglobin (Hb A1C)  Urine frequency - UA/reflex today, consider urology referral, pt will defer this for now  - Plan: Urinalysis, Routine w reflex microscopic, Urine Microscopic  Need for diphtheria-tetanus-pertussis (Tdap) vaccine  Urine WBC increased - Plan: Urine Microscopic, Urine Culture, sulfamethoxazole-trimethoprim (BACTRIM DS) 800-160 MG tablet      Follow-up plan: Return in about 3 months (around 03/15/2017) for diabetes follow-up .  Visit summary with medication list and pertinent instructions was printed for patient to review, alert [place] if any changes needed. All questions at time of visit were answered - patient instructed to contact office with any additional concerns. ER/RTC precautions were reviewed with the patient and understanding verbalized.

## 2016-12-14 ENCOUNTER — Other Ambulatory Visit: Payer: Self-pay | Admitting: *Deleted

## 2016-12-14 DIAGNOSIS — R82998 Other abnormal findings in urine: Secondary | ICD-10-CM

## 2016-12-14 LAB — URINALYSIS, ROUTINE W REFLEX MICROSCOPIC
BILIRUBIN URINE: NEGATIVE
Glucose, UA: NEGATIVE
HGB URINE DIPSTICK: NEGATIVE
KETONES UR: NEGATIVE
NITRITE: NEGATIVE
PROTEIN: NEGATIVE
Specific Gravity, Urine: 1.012 (ref 1.001–1.035)
pH: 6.5 (ref 5.0–8.0)

## 2016-12-14 LAB — URINALYSIS, MICROSCOPIC ONLY
CRYSTALS: NONE SEEN [HPF]
Casts: NONE SEEN [LPF]
Squamous Epithelial / LPF: NONE SEEN [HPF] (ref <=5)
Yeast: NONE SEEN [HPF]

## 2016-12-14 MED ORDER — SULFAMETHOXAZOLE-TRIMETHOPRIM 800-160 MG PO TABS: 1.0000 | ORAL_TABLET | Freq: Two times a day (BID) | ORAL | 0 refills | Status: DC

## 2016-12-14 NOTE — Addendum Note (Signed)
Addended by: Maryla Morrow on: 12/14/2016 07:39 AM   Modules accepted: Orders

## 2016-12-19 ENCOUNTER — Ambulatory Visit (INDEPENDENT_AMBULATORY_CARE_PROVIDER_SITE_OTHER): Payer: BLUE CROSS/BLUE SHIELD | Admitting: Osteopathic Medicine

## 2016-12-19 VITALS — BP 107/60 | HR 67 | Resp 16 | Wt 134.0 lb

## 2016-12-19 DIAGNOSIS — R350 Frequency of micturition: Secondary | ICD-10-CM

## 2016-12-19 LAB — POCT URINALYSIS DIPSTICK
BILIRUBIN UA: NEGATIVE
Blood, UA: NEGATIVE
GLUCOSE UA: NEGATIVE
Ketones, UA: NEGATIVE
Leukocytes, UA: NEGATIVE
Nitrite, UA: NEGATIVE
Protein, UA: NEGATIVE
SPEC GRAV UA: 1.015 (ref 1.010–1.025)
UROBILINOGEN UA: 0.2 U/dL
pH, UA: 6 (ref 5.0–8.0)

## 2016-12-19 NOTE — Progress Notes (Signed)
Pt presents to clinic to repeat UA and culture after having urinary frequency and WBC's in urine. Pt states she's not having any symptoms at this time.

## 2016-12-21 LAB — URINE CULTURE: ORGANISM ID, BACTERIA: NO GROWTH

## 2017-01-14 ENCOUNTER — Other Ambulatory Visit: Payer: Self-pay | Admitting: Osteopathic Medicine

## 2017-01-14 DIAGNOSIS — F17200 Nicotine dependence, unspecified, uncomplicated: Secondary | ICD-10-CM

## 2017-02-02 DIAGNOSIS — H401111 Primary open-angle glaucoma, right eye, mild stage: Secondary | ICD-10-CM | POA: Diagnosis not present

## 2017-02-02 DIAGNOSIS — H401121 Primary open-angle glaucoma, left eye, mild stage: Secondary | ICD-10-CM | POA: Diagnosis not present

## 2017-02-10 ENCOUNTER — Emergency Department
Admission: EM | Admit: 2017-02-10 | Discharge: 2017-02-10 | Disposition: A | Discharge status: Home / Self Care | Payer: BLUE CROSS/BLUE SHIELD | Source: Home / Self Care | Attending: Family Medicine | Admitting: Family Medicine

## 2017-02-10 ENCOUNTER — Encounter: Payer: Self-pay | Admitting: Emergency Medicine

## 2017-02-10 DIAGNOSIS — M25512 Pain in left shoulder: Secondary | ICD-10-CM

## 2017-02-10 MED ORDER — HYDROCODONE-ACETAMINOPHEN 5-325 MG PO TABS: 1.0000 | ORAL_TABLET | Freq: Four times a day (QID) | ORAL | 0 refills | Status: DC | PRN

## 2017-02-10 MED ORDER — PREDNISONE 20 MG PO TABS: ORAL_TABLET | ORAL | 0 refills | Status: DC

## 2017-02-10 NOTE — Discharge Instructions (Signed)
Apply ice pack for 20 to 30 minutes, 3 to 4 times daily  Continue until pain and swelling decrease.  May try gentle pendulum exercises as tolerated.

## 2017-02-10 NOTE — ED Triage Notes (Signed)
Reports "frozen shoulder'' left side with onset 4 days ago; was diagnosed with this in past by Dr.T and has appt. With him on 02/12/17 for probable injection treatment. Symptoms worsened around 0300 today; has take tramadol, zanaflex and gabapentin since 0400.

## 2017-02-10 NOTE — ED Provider Notes (Signed)
[name]    CSN: [id number] Arrival date & time: 02/10/17  1103     History   Chief Complaint Chief Complaint  Patient presents with  . Shoulder Pain    HPI [name] is a 56 y.o. female.   Patient complains of onset of left "frozen shoulder" four days ago.  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 Aundria [name] on 02/12/17 for possible corticosteroid injection.  Her symptoms worsened about 0300 today, and have not improved with tramadol, Zanaflex, and gabapentin.  She recalls no injury, but admits that she has increased her rowing exercises during the past several days.   The history is provided by the patient.  Shoulder Pain  Location:  Shoulder Shoulder location:  L shoulder Pain details:    Quality:  Aching   Radiates to:  Does not radiate   Severity:  Severe   Onset quality:  Sudden   Duration:  7 hours   Timing:  Constant   Progression:  Worsening Prior injury to area:  No Relieved by:  Nothing Worsened by:  Movement Ineffective treatments: tramadol, Zanaflex, and gabapentin. Associated symptoms: decreased range of motion, muscle weakness, neck pain and stiffness   Associated symptoms: no fever, no numbness, no swelling and no tingling     Past Medical History:  Diagnosis Date  . Diabetes mellitus without complication (Angel [name])   . Glaucoma   . History of bone density study   . Hypertension     Patient Active Problem List   Diagnosis Date Noted  . Lymphocytosis 11/08/2016  . Numbness and tingling in both hands 09/04/2016  . Primary osteoarthritis, left shoulder 05/23/2016  . Cervical spinal stenosis 12/13/2015  . Acne vulgaris 08/03/2014  . Diabetes mellitus type 2 in nonobese (Angel [name]) 03/11/2013  . Essential hypertension 03/11/2013  . Glaucoma (increased eye pressure) 03/11/2013  . Menopausal and postmenopausal disorder 03/11/2013  . IDDM (insulin  dependent diabetes mellitus) (Angel [name]) 02/23/2012  . NS (nuclear sclerosis) 02/23/2012  . Primary open angle glaucoma 02/23/2012    Past Surgical History:  Procedure Laterality Date  . APPENDECTOMY    . TONSILLECTOMY AND ADENOIDECTOMY    . TUBAL LIGATION      OB History    No data available       Home Medications    Prior to Admission medications   Medication Sig Start Date End Date Taking? Authorizing Provider  aspirin 81 MG chewable tablet Chew 81 mg by mouth.    [provider]  Blood Glucose Monitoring Suppl (Angel [name]) w/Device KIT  12/09/12   [provider]  [employer] PAK 1 MG tablet TAKE AS DIRECTED ON PACKAGE 01/15/17   Angel Reeve, DO  Cholecalciferol (VITAMIN D3) 2000 units TABS Take by mouth.    [provider]  gabapentin (NEURONTIN) 600 MG tablet take 2 tablets by mouth twice a day 11/07/16   Angel Decamp, MD  HYDROcodone-acetaminophen (NORCO/VICODIN) 5-325 MG tablet Take 1 tablet by mouth every 6 (six) hours as needed for moderate pain. 02/10/17   Angel Nicolas, MD  Insulin Glargine (LANTUS SOLOSTAR) 100 UNIT/ML Solostar Pen Inject 15 Units into the skin daily. 08/09/16   Angel Reeve, DO  latanoprost Angel [name]) 0.005 % ophthalmic solution  05/09/11   [provider]  losartan-hydrochlorothiazide Angel [name]) 100-12.5 MG tablet take 1 tablet by mouth once daily 10/03/16   Angel Reeve, DO  medroxyPROGESTERone (  PROVERA) 10 MG tablet take 1 tablet by mouth once daily 08/14/16   Angel Reeve, DO  predniSONE (DELTASONE) 20 MG tablet Take one tab by mouth twice daily for 4 days, then one daily for 3 days. Take with food. 02/10/17   Angel Nicolas, MD  saxagliptin HCl (ONGLYZA) 5 MG TABS tablet Take 1 tablet (5 mg total) by mouth daily. 09/04/16   Angel Reeve, DO  sulfamethoxazole-trimethoprim (BACTRIM DS) 800-160 MG tablet Take 1 tablet by mouth 2 (two) times daily. 12/14/16   Angel Reeve, DO  timolol (TIMOPTIC) 0.5 % ophthalmic solution 0.5 mLs. 11/21/15   [provider]  tiZANidine (ZANAFLEX) 4 MG tablet take 1 to 2 tablets by mouth every evening if needed for SPASM 04/26/16   [provider]  traMADol Angel [name]) 50 MG tablet take 1 tablet by mouth twice a day 11/28/16   Angel Decamp, MD    Family History Family History  Problem Relation Age of Onset  . Hypertension Mother   . Asthma Sister   . Hypertension Sister   . Diabetes Brother   . Hypertension Brother   . Cancer Maternal Grandfather     Social History Social History  Substance Use Topics  . Smoking status: Former Research scientist (life sciences)  . Smokeless tobacco: Never Used  . Alcohol use No     Allergies   Patient has no known allergies.   Review of Systems Review of Systems  Constitutional: Negative for fever.  Musculoskeletal: Positive for neck pain and stiffness.  All other systems reviewed and are negative.    Physical Exam Triage Vital Signs ED Triage Vitals  Enc Vitals Group     BP 02/10/17 1152 (!) 151/80     Pulse Rate 02/10/17 1152 80     Resp 02/10/17 1152 16     Temp 02/10/17 1152 (!) 97.5 F (36.4 C)     Temp Source 02/10/17 1152 Oral     SpO2 02/10/17 1152 99 %     Weight 02/10/17 1153 135 lb (61.2 kg)     Height 02/10/17 1153 5' 5"  (1.651 m)     Head Circumference --      Peak Flow --      Pain Score 02/10/17 1154 10     Pain Loc --      Pain Edu? --      Excl. in [place]? --    No data found.   Updated Vital Signs BP (!) 151/80 (BP Location: Right Arm)   Pulse 80   Temp (!) 97.5 F (36.4 C) (Oral)   Resp 16   Ht 5' 5"  (1.651 m)   Wt 135 lb (61.2 kg)   SpO2 99%   BMI 22.47 kg/m   Visual Acuity Right Eye Distance:   Left Eye Distance:   Bilateral Distance:    Right Eye Near:   Left Eye Near:    Bilateral Near:     Physical Exam  Constitutional: She appears well-developed and well-nourished. No distress.  HENT:  Head: Normocephalic.    Right Ear: External ear normal.  Left Ear: External ear normal.  Nose: Nose normal.  Mouth/Throat: Oropharynx is clear and moist.  Eyes: Pupils are equal, round, and reactive to light.  Cardiovascular: Normal heart sounds.   Pulmonary/Chest: Breath sounds normal.  Musculoskeletal:       Left shoulder: She exhibits decreased range of motion and decreased strength. She exhibits no tenderness, no bony tenderness, no swelling, no effusion and no crepitus.  Patient has limited range of motion in her left shoulder with difficulty abducting.  Unable to perform Apley's test.  Minimal tenderness.  Distal neurovascular function is intact.  Lymphadenopathy:    She has no cervical adenopathy.  Neurological: She is alert.  Skin: Skin is warm and dry.  Nursing note and vitals reviewed.    UC Treatments / Results  Labs (all labs ordered are listed, but only abnormal results are displayed) Labs Reviewed - No data to display  EKG  EKG Interpretation None       Radiology No results found.  Procedures Procedures (including critical care time)  Medications Ordered in UC Medications - No data to display   Initial Impression / Assessment and Plan / UC Course  I have reviewed the triage vital signs and the nursing notes.  Pertinent labs & imaging results that were available during my care of the patient were reviewed by me and considered in my medical decision making (see chart for details).    Begin prednisone burst/taper. Rx for Lortab (#12, no refill) Controlled Substance Prescriptions I have consulted the [place] Controlled Substances Registry for this patient, and feel the risk/benefit ratio today is favorable for proceeding with this prescription for a controlled substance.   Apply ice pack for 20 to 30 minutes, 3 to 4 times daily  Continue until pain and swelling decrease.  May try gentle pendulum exercises as tolerated. Followup with Dr. Aundria [name] ([facility])  in two days as scheduled.    Final Clinical Impressions(s) / UC Diagnoses   Final diagnoses:  Acute pain of left shoulder    New Prescriptions New Prescriptions   HYDROCODONE-ACETAMINOPHEN (NORCO/VICODIN) 5-325 MG TABLET    Take 1 tablet by mouth every 6 (six) hours as needed for moderate pain.   PREDNISONE (DELTASONE) 20 MG TABLET    Take one tab by mouth twice daily for 4 days, then one daily for 3 days. Take with food.         Angel Nicolas, MD 02/17/17 [phone]

## 2017-02-12 ENCOUNTER — Ambulatory Visit (INDEPENDENT_AMBULATORY_CARE_PROVIDER_SITE_OTHER): Payer: BLUE CROSS/BLUE SHIELD | Admitting: Sports Medicine

## 2017-02-12 ENCOUNTER — Encounter: Payer: Self-pay | Admitting: Sports Medicine

## 2017-02-12 DIAGNOSIS — M4802 Spinal stenosis, cervical region: Secondary | ICD-10-CM | POA: Diagnosis not present

## 2017-02-12 DIAGNOSIS — M19012 Primary osteoarthritis, left shoulder: Secondary | ICD-10-CM | POA: Diagnosis not present

## 2017-02-12 MED ORDER — PREGABALIN 75 MG PO CAPS: 75.0000 mg | ORAL_CAPSULE | Freq: Two times a day (BID) | ORAL | 3 refills | Status: DC

## 2017-02-12 NOTE — Assessment & Plan Note (Signed)
Advanced glenohumeral degenerative changes. Last glenohumeral injection was in June 2018.  Repeat left glenohumeral injection as above, return as needed for this.

## 2017-02-12 NOTE — Progress Notes (Signed)
Subjective:    CC: Left shoulder pain  HPI: Angel [name] returns, she has advanced left glenohumeral degenerative changes, previous injection was almost 4 months ago. Having recurrence of pain, moderate, persistent, localized in the left shoulder without radiation and desires repeat interventional treatment today.  She does have fairly significant cervical spondylosis, we have done a lot in terms of physical therapy, multiple oral medications including max dose gabapentin, several epidurals without relief, she did have temporary relief with bilateral C4-C7 facet joint injections but did not want to proceed to RFA. At this point she would like to try Lyrica, I have advised her to discuss this again with neurosurgery.  Past medical history:  Negative.  See flowsheet/record as well for more information.  Surgical history: Negative.  See flowsheet/record as well for more information.  Family history: Negative.  See flowsheet/record as well for more information.  Social history: Negative.  See flowsheet/record as well for more information.  Allergies, and medications have been entered into the medical record, reviewed, and no changes needed.   Review of Systems: No fevers, chills, night sweats, weight loss, chest pain, or shortness of breath.   Objective:    General: Well Developed, well nourished, and in no acute distress.  Neuro: Alert and oriented x3, extra-ocular muscles intact, sensation grossly intact.  HEENT: Normocephalic, atraumatic, pupils equal round reactive to light, neck supple, no masses, no lymphadenopathy, thyroid nonpalpable.  Skin: Warm and dry, no rashes. Cardiac: Regular rate and rhythm, no murmurs rubs or gallops, no lower extremity edema.  Respiratory: Clear to auscultation bilaterally. Not using accessory muscles, speaking in full sentences. Left Shoulder: Inspection reveals no abnormalities, atrophy or asymmetry. Palpation is normal with no tenderness over AC joint or  bicipital groove. ROM is limited to abduction and external rotation Rotator cuff strength normal throughout. No signs of impingement with negative Neer and Hawkin's tests, empty can. Speeds and Yergason's tests normal. Positive Obrien's, negative crank, negative clunk, and good stability. Normal scapular function observed. No painful arc and no drop arm sign. No apprehension sign  Procedure: Real-time Ultrasound Guided Injection of left glenohumeral joint Device: GE Logiq E  Verbal informed consent obtained.  Time-out conducted.  Noted no overlying erythema, induration, or other signs of local infection.  Skin prepped in a sterile fashion.  Local anesthesia: Topical Ethyl chloride.  With sterile technique and under real time ultrasound guidance:  Using a 22-gauge spinal needle advanced into the glenohumeral joint taking care to avoid the labrum, I then injected 1 mL kenalog 40, 2 mL lidocaine, 2 mL bupivacaine. Completed without difficulty  Pain immediately resolved suggesting accurate placement of the medication.  Advised to call if fevers/chills, erythema, induration, drainage, or persistent bleeding.  Images permanently stored and available for review in the ultrasound unit.  Impression: Technically successful ultrasound guided injection.  Impression and Recommendations:    Primary osteoarthritis, left shoulder Advanced glenohumeral degenerative changes. Last glenohumeral injection was in June 2018.  Repeat left glenohumeral injection as above, return as needed for this.  Cervical spinal stenosis There is central canal stenosis with C3-C6 degenerative changes. At this point has been through physical therapy, 3 epidurals, as well as maximum dose gabapentin. She had no improvement in pain with the epidurals. We then proceeded with bilateral C4-C7 facet joint injections, she did have some temporary relief suggesting that the facets maybe pain generators, she does not want to  proceed to further intervention on the facets however radio frequency ablation would be an option. At  this point we are going to switch from gabapentin to Lyrica. Starting with 75 mg twice a day, considering she needed maximum dose gabapentin I do suspect we will have to go up to maximum dose Lyrica slowly. Return in one month. She does have [facility] for medical pain management, and she is going to go back to Dr. Christella Noa to discuss ACDF.  ___________________________________________ Gwen Her. Dianah Field, M.D., ABFM., CAQSM. Primary Care and [employer] [facility] [employer]

## 2017-02-12 NOTE — Assessment & Plan Note (Signed)
There is central canal stenosis with C3-C6 degenerative changes. At this point has been through physical therapy, 3 epidurals, as well as maximum dose gabapentin. She had no improvement in pain with the epidurals. We then proceeded with bilateral C4-C7 facet joint injections, she did have some temporary relief suggesting that the facets maybe pain generators, she does not want to proceed to further intervention on the facets however radio frequency ablation would be an option. At this point we are going to switch from gabapentin to Lyrica. Starting with 75 mg twice a day, considering she needed maximum dose gabapentin I do suspect we will have to go up to maximum dose Lyrica slowly. Return in one month. She does have [facility] for medical pain management, and she is going to go back to Dr. Christella Noa to discuss ACDF.

## 2017-02-13 ENCOUNTER — Telehealth: Payer: Self-pay | Admitting: *Deleted

## 2017-02-13 NOTE — Telephone Encounter (Signed)
Pre Authorization sent to cover my meds. [facility]

## 2017-02-21 ENCOUNTER — Other Ambulatory Visit: Payer: Self-pay | Admitting: Osteopathic Medicine

## 2017-02-26 MED ORDER — DULOXETINE HCL 30 MG PO CPEP: 30.0000 mg | ORAL_CAPSULE | Freq: Every day | ORAL | 0 refills | Status: DC

## 2017-02-26 NOTE — Telephone Encounter (Signed)
Lyrica denied. Has to try and fail duloxetine

## 2017-02-26 NOTE — Telephone Encounter (Signed)
Patient states she would like to try and take the Cymbalta. Please advise.

## 2017-02-26 NOTE — Telephone Encounter (Signed)
She has a relative contraindication, she is on fairly high doses of tramadol. Will you resubmit with this relative contraindication to adding an SSRI or SNRI?

## 2017-02-26 NOTE — Telephone Encounter (Signed)
Cymbalta 30mg  daily sent in, return after 4 weeks treatment with this low dose.

## 2017-02-27 ENCOUNTER — Other Ambulatory Visit: Payer: Self-pay

## 2017-02-27 MED ORDER — DULOXETINE HCL 30 MG PO CPEP: 30.0000 mg | ORAL_CAPSULE | Freq: Every day | ORAL | 0 refills | Status: DC

## 2017-02-27 NOTE — Telephone Encounter (Signed)
Pt has a historical rx for zanaflex and would like to have it refilled. Please route to your CMA.

## 2017-02-27 NOTE — Telephone Encounter (Signed)
Patient advised and sent to the front to reschedule her appointment out to 4 weeks.

## 2017-02-28 ENCOUNTER — Other Ambulatory Visit: Payer: Self-pay

## 2017-02-28 ENCOUNTER — Other Ambulatory Visit: Payer: Self-pay | Admitting: Osteopathic Medicine

## 2017-02-28 MED ORDER — GABAPENTIN 600 MG PO TABS: 1200.0000 mg | ORAL_TABLET | Freq: Two times a day (BID) | ORAL | 1 refills | Status: DC

## 2017-02-28 NOTE — Telephone Encounter (Signed)
Routed to Dr T he has been managing pain.

## 2017-02-28 NOTE — Progress Notes (Signed)
Responding to request for Tizanidine. I've never prescribed this. Looks like she takes this in the evenings prn. I looked at your last note 02/12/17. Thought we may consider increasing lyrica instead but looks like she is on Cymbalta since Lyrica not covered? Still taking gabapentin maybe? She's not quite due to follow up with you - has appt 03/12/17

## 2017-03-01 MED ORDER — TIZANIDINE HCL 4 MG PO TABS: 4.0000 mg | ORAL_TABLET | Freq: Every day | ORAL | 3 refills | Status: DC

## 2017-03-01 MED ORDER — GABAPENTIN 600 MG PO TABS: 1200.0000 mg | ORAL_TABLET | Freq: Two times a day (BID) | ORAL | 1 refills | Status: DC

## 2017-03-01 NOTE — Progress Notes (Signed)
Yes, Lyrica was not covered so we started Cymbalta, I'm okay with doing Zanaflex. She can continue gabapentin for now.  I'm happy to do whatever dose she is on.

## 2017-03-01 NOTE — Progress Notes (Signed)
Thanks! I was gonna send it but looks like you beat me.

## 2017-03-01 NOTE — Addendum Note (Signed)
Addended by: Maryla Morrow on: 03/01/2017 12:33 PM   Modules accepted: Orders

## 2017-03-12 ENCOUNTER — Ambulatory Visit: Payer: BLUE CROSS/BLUE SHIELD | Admitting: Sports Medicine

## 2017-03-15 ENCOUNTER — Encounter: Payer: Self-pay | Admitting: Osteopathic Medicine

## 2017-03-15 ENCOUNTER — Ambulatory Visit (INDEPENDENT_AMBULATORY_CARE_PROVIDER_SITE_OTHER): Payer: BLUE CROSS/BLUE SHIELD | Admitting: Osteopathic Medicine

## 2017-03-15 VITALS — BP 133/77 | HR 70 | Wt 133.0 lb

## 2017-03-15 DIAGNOSIS — I10 Essential (primary) hypertension: Secondary | ICD-10-CM

## 2017-03-15 DIAGNOSIS — E119 Type 2 diabetes mellitus without complications: Secondary | ICD-10-CM

## 2017-03-15 DIAGNOSIS — Z23 Encounter for immunization: Secondary | ICD-10-CM

## 2017-03-15 LAB — POCT GLYCOSYLATED HEMOGLOBIN (HGB A1C): HEMOGLOBIN A1C: 8.2

## 2017-03-15 NOTE — Progress Notes (Signed)
HPI: Angel [name] is a 56 y.o. female  who presents to [place] today, 03/15/17,  for chief complaint of:  Chief Complaint  Patient presents with  . Follow-up    DIABETES    DIABETES SCREENING/PREVENTIVE CARE: A1C past 3-6 mos: Yes  controlled? Yes   05/08/16: 7.9%  09/04/16: 8.1%  12/13/16: 7.6% on Lantus 15 daily, saxagliptin 5 daily  03/15/17: 8.2% has been cheating on diet a little bit  BP goal <130/80: Yes  LDL goal <70: close! 80 Eye exam annually: none on file, importance discussed with patient - will try to find in [place] (from [facility])  Foot exam: Yes 12/13/16  Microalbuminuria: n/a Metformin: No - intolerant ACE/ARB: Yes  Antiplatelet if ASCVD Risk >10%: Yes  Statin: declined  Pneumovax: Yes 09/04/16      Past medical history, surgical history, social history and family history reviewed.  Patient Active Problem List   Diagnosis Date Noted  . Lymphocytosis 11/08/2016  . Numbness and tingling in both hands 09/04/2016  . Primary osteoarthritis, left shoulder 05/23/2016  . Cervical spinal stenosis 12/13/2015  . Acne vulgaris 08/03/2014  . Diabetes mellitus type 2 in nonobese ([place]) 03/11/2013  . Essential hypertension 03/11/2013  . Glaucoma (increased eye pressure) 03/11/2013  . Menopausal and postmenopausal disorder 03/11/2013  . IDDM (insulin dependent diabetes mellitus) ([place]) 02/23/2012  . NS (nuclear sclerosis) 02/23/2012  . Primary open angle glaucoma 02/23/2012    Current medication list and allergy/intolerance information reviewed.   Current Outpatient Prescriptions on File Prior to Visit  Medication Sig Dispense Refill  . aspirin 81 MG chewable tablet Chew 81 mg by mouth.    . Blood Glucose Monitoring Suppl ([place]) w/Device KIT     . CHANTIX CONTINUING MONTH PAK 1 MG tablet TAKE AS DIRECTED ON PACKAGE 56 tablet 0  . Cholecalciferol (VITAMIN D3) 2000 units TABS Take by mouth.    .  DULoxetine (CYMBALTA) 30 MG capsule Take 1 capsule (30 mg total) by mouth daily. 90 capsule 0  . gabapentin (NEURONTIN) 600 MG tablet Take 2 tablets (1,200 mg total) by mouth 2 (two) times daily. 120 tablet 1  . Insulin Glargine (LANTUS SOLOSTAR) 100 UNIT/ML Solostar Pen Inject 15 Units into the skin daily. 3 mL 5  . latanoprost (XALATAN) 0.005 % ophthalmic solution     . losartan-hydrochlorothiazide (HYZAAR) 100-12.5 MG tablet take 1 tablet by mouth once daily 30 tablet 6  . medroxyPROGESTERone (PROVERA) 10 MG tablet TAKE 1 TABLET BY MOUTH ONCE DAILY 30 tablet 0  . saxagliptin HCl (ONGLYZA) 5 MG TABS tablet Take 1 tablet (5 mg total) by mouth daily. 90 tablet 3  . timolol (TIMOPTIC) 0.5 % ophthalmic solution 0.5 mLs.    Marland Kitchen tiZANidine (ZANAFLEX) 4 MG tablet Take 1-2 tablets (4-8 mg total) by mouth at bedtime. 60 tablet 3  . traMADol (ULTRAM) 50 MG tablet take 1 tablet by mouth twice a day 60 tablet 3   No current facility-administered medications on file prior to visit.    No Known Allergies    Review of Systems:  Constitutional: No recent illness  Cardiac: No  chest pain, No  pressure  Respiratory:  No  shortness of breath. No  Cough  Gastrointestinal: No  abdominal pain  Musculoskeletal: No new myalgia/arthralgia  Skin: No  Rash  Exam:  BP 133/77   Pulse 70   Wt 133 lb (60.3 kg)   BMI 22.13 kg/m   Constitutional: VS see above.  General Appearance: alert, well-developed, well-nourished, NAD  Eyes: Normal lids and conjunctive, non-icteric sclera  Ears, Nose, Mouth, Throat: MMM, Normal external inspection ears/nares/mouth/lips/gums.  Neck: No masses, trachea midline.   Respiratory: Normal respiratory effort. no wheeze, no rhonchi, no rales  Cardiovascular: S1/S2 normal, no murmur, no rub/gallop auscultated. RRR.   Musculoskeletal: Gait normal. Symmetric and independent movement of all extremities  Neurological: Normal balance/coordination. No tremor.  Skin: warm,  dry, intact.   Psychiatric: Normal judgment/insight. Normal mood and affect. Oriented x3.     ASSESSMENT/PLAN:   Diabetes mellitus without complication ([place]) - discussed dietary modifications - be better about carbohydrates!  - Plan: POCT HgB A1C  Need for influenza vaccination - Plan: Flu Vaccine QUAD 36+ mos IM  Essential hypertension - controlled      Follow-up plan: Return in about 3 months (around 06/15/2017) for follow-up diabetes .  Visit summary with medication list and pertinent instructions was printed for patient to review, alert [place] if any changes needed. All questions at time of visit were answered - patient instructed to contact office with any additional concerns. ER/RTC precautions were reviewed with the patient and understanding verbalized.   Note: Total time spent 15 minutes, greater than 50% of the visit was spent face-to-face counseling and coordinating care for the following: The primary encounter diagnosis was Diabetes mellitus without complication ([place]). Diagnoses of Need for influenza vaccination and Essential hypertension were also pertinent to this visit.Marland Kitchen

## 2017-03-21 ENCOUNTER — Other Ambulatory Visit: Payer: Self-pay | Admitting: Osteopathic Medicine

## 2017-03-21 ENCOUNTER — Other Ambulatory Visit: Payer: Self-pay | Admitting: Sports Medicine

## 2017-03-21 DIAGNOSIS — F17200 Nicotine dependence, unspecified, uncomplicated: Secondary | ICD-10-CM

## 2017-03-23 ENCOUNTER — Ambulatory Visit (HOSPITAL_BASED_OUTPATIENT_CLINIC_OR_DEPARTMENT_OTHER): Payer: BLUE CROSS/BLUE SHIELD | Admitting: Family

## 2017-03-23 ENCOUNTER — Other Ambulatory Visit (HOSPITAL_BASED_OUTPATIENT_CLINIC_OR_DEPARTMENT_OTHER): Payer: BLUE CROSS/BLUE SHIELD

## 2017-03-23 VITALS — BP 145/80 | HR 57 | Temp 98.1°F | Resp 16 | Wt 132.0 lb

## 2017-03-23 DIAGNOSIS — D72829 Elevated white blood cell count, unspecified: Secondary | ICD-10-CM

## 2017-03-23 DIAGNOSIS — D508 Other iron deficiency anemias: Secondary | ICD-10-CM | POA: Diagnosis not present

## 2017-03-23 DIAGNOSIS — D72820 Lymphocytosis (symptomatic): Secondary | ICD-10-CM | POA: Diagnosis not present

## 2017-03-23 LAB — COMPREHENSIVE METABOLIC PANEL WITH GFR
ALT: 28 U/L (ref 0–55)
AST: 33 U/L (ref 5–34)
Albumin: 4.2 g/dL (ref 3.5–5.0)
Alkaline Phosphatase: 98 U/L (ref 40–150)
Anion Gap: 10 meq/L (ref 3–11)
BUN: 11.3 mg/dL (ref 7.0–26.0)
CO2: 25 meq/L (ref 22–29)
Calcium: 9.3 mg/dL (ref 8.4–10.4)
Chloride: 105 meq/L (ref 98–109)
Creatinine: 1 mg/dL (ref 0.6–1.1)
EGFR: >60 ml/min/1.73 m2
Glucose: 149 mg/dL — ABNORMAL HIGH (ref 70–140)
Potassium: 4.3 meq/L (ref 3.5–5.1)
Sodium: 141 meq/L (ref 136–145)
Total Bilirubin: 0.39 mg/dL (ref 0.20–1.20)
Total Protein: 6.7 g/dL (ref 6.4–8.3)

## 2017-03-23 LAB — IRON AND TIBC
%SAT: 32 % (ref 21–57)
Iron: 100 ug/dL (ref 41–142)
TIBC: 309 ug/dL (ref 236–444)
UIBC: 208 ug/dL (ref 120–384)

## 2017-03-23 LAB — CBC WITH DIFFERENTIAL (CANCER CENTER ONLY)
BASO#: 0 10*3/uL (ref 0.0–0.2)
BASO%: 0.2 % (ref 0.0–2.0)
EOS%: 0.6 % (ref 0.0–7.0)
Eosinophils Absolute: 0.1 10*3/uL (ref 0.0–0.5)
HCT: 34.4 % — ABNORMAL LOW (ref 34.8–46.6)
HGB: 11.6 g/dL (ref 11.6–15.9)
LYMPH#: 7.5 10*3/uL — ABNORMAL HIGH (ref 0.9–3.3)
LYMPH%: 73.1 % — ABNORMAL HIGH (ref 14.0–48.0)
MCH: 31.2 pg (ref 26.0–34.0)
MCHC: 33.7 g/dL (ref 32.0–36.0)
MCV: 93 fL (ref 81–101)
MONO#: 0.5 10*3/uL (ref 0.1–0.9)
MONO%: 5.3 % (ref 0.0–13.0)
NEUT#: 2.2 10*3/uL (ref 1.5–6.5)
NEUT%: 20.8 % — ABNORMAL LOW (ref 39.6–80.0)
Platelets: 232 10*3/uL (ref 145–400)
RBC: 3.72 10*6/uL (ref 3.70–5.32)
RDW: 12.5 % (ref 11.1–15.7)
WBC: 10.3 10*3/uL — ABNORMAL HIGH (ref 3.9–10.0)

## 2017-03-23 LAB — FERRITIN: Ferritin: 70 ng/ml (ref 9–269)

## 2017-03-23 LAB — LACTATE DEHYDROGENASE: LDH: 254 U/L — ABNORMAL HIGH (ref 125–245)

## 2017-03-23 NOTE — Progress Notes (Signed)
Hematology and Oncology Follow Up Visit  Angel [name] [id number] 10/25/1960 56 y.o. 03/23/2017   Principle Diagnosis:  Lymphoproliferative process  Current Therapy:   Observation   Interim History:  Angel [name] is here today with her mother for follow-up. Shockingly her flow cytometry was normal so we are following along with her monitoring her counts. She remains asymptomatic and has no complaints at this time.  WBC count is 10.3, no anemia, platelet count 232 and Lymphocyte % 73.1.  No lymphadenopathy found on exam. No episodes of bleeding, bruising or petechiae.  She denies fatigue. No fever, chills, n/v, cough, rash, dizziness, SOB, chest pain, palpitations, abdominal pain or changes in bowel or bladder habits.  No swelling, tenderness, numbness or tingling in her extremities. No c/o pain.  She has maintained a good appetite and is staying well hydrated. Her weight is stable.   ECOG Performance Status: 0 - Asymptomatic  Medications:  Allergies as of 03/23/2017   No Known Allergies     Medication List       Accurate as of 03/23/17  9:55 AM. Always use your most recent med list.          aspirin 81 MG chewable tablet Chew 81 mg by mouth.   CHANTIX CONTINUING MONTH PAK 1 MG tablet Generic drug:  varenicline TAKE AS DIRECTED   CLEVER CHEK SYSTEM w/Device Kit   DULoxetine 30 MG capsule Commonly known as:  CYMBALTA Take 1 capsule (30 mg total) by mouth daily.   gabapentin 600 MG tablet Commonly known as:  NEURONTIN Take 2 tablets (1,200 mg total) by mouth 2 (two) times daily.   Insulin Glargine 100 UNIT/ML Solostar Pen Commonly known as:  LANTUS SOLOSTAR Inject 15 Units into the skin daily.   latanoprost 0.005 % ophthalmic solution Commonly known as:  XALATAN   losartan-hydrochlorothiazide 100-12.5 MG tablet Commonly known as:  HYZAAR take 1 tablet by mouth once daily   medroxyPROGESTERone 10 MG tablet Commonly known as:  PROVERA TAKE 1 TABLET BY MOUTH  ONCE DAILY   saxagliptin HCl 5 MG Tabs tablet Commonly known as:  ONGLYZA Take 1 tablet (5 mg total) by mouth daily.   timolol 0.5 % ophthalmic solution Commonly known as:  TIMOPTIC 0.5 mLs.   tiZANidine 4 MG tablet Commonly known as:  ZANAFLEX Take 1-2 tablets (4-8 mg total) by mouth at bedtime.   traMADol 50 MG tablet Commonly known as:  ULTRAM TAKE 1 TABLET BY MOUTH TWICE DAILY   Vitamin D3 2000 units Tabs Take by mouth.       Allergies: No Known Allergies  Past Medical History, Surgical history, Social history, and Family History were reviewed and updated.  Review of Systems: All other 10 point review of systems is negative.   Physical Exam:  weight is 132 lb (59.9 kg). Her oral temperature is 98.1 F (36.7 C). Her blood pressure is 145/80 (abnormal) and her pulse is 57 (abnormal). Her respiration is 16 and oxygen saturation is 100%.   Wt Readings from Last 3 Encounters:  03/23/17 132 lb (59.9 kg)  03/15/17 133 lb (60.3 kg)  02/12/17 133 lb 12.8 oz (60.7 kg)    Ocular: Sclerae unicteric, pupils equal, round and reactive to light Ear-nose-throat: Oropharynx clear, dentition fair Lymphatic: No cervical, supraclavicular or axillary adenopathy Lungs no rales or rhonchi, good excursion bilaterally Heart regular rate and rhythm, no murmur appreciated Abd soft, nontender, positive bowel sounds, no liver or spleen tip palpated on exam, no fluid wave  MSK  no focal spinal tenderness, no joint edema Neuro: non-focal, well-oriented, appropriate affect Breasts: Deferred   Lab Results  Component Value Date   WBC 10.3 (H) 03/23/2017   HGB 11.6 03/23/2017   HCT 34.4 (L) 03/23/2017   MCV 93 03/23/2017   PLT 232 03/23/2017   No results found for: FERRITIN, IRON, TIBC, UIBC, IRONPCTSAT Lab Results  Component Value Date   RBC 3.72 03/23/2017   No results found for: KPAFRELGTCHN, LAMBDASER, KAPLAMBRATIO No results found for: IGGSERUM, IGA, IGMSERUM No results found  for: Odetta Pink, SPEI   Chemistry      Component Value Date/Time   NA 141 11/21/2016 0917   K 5.1 11/21/2016 0917   CL 100 09/04/2016 0917   CO2 27 11/21/2016 0917   BUN 15.5 11/21/2016 0917   CREATININE 1.3 (H) 11/21/2016 0917      Component Value Date/Time   CALCIUM 9.5 11/21/2016 0917   ALKPHOS 83 11/21/2016 0917   AST 29 11/21/2016 0917   ALT 28 11/21/2016 0917   BILITOT 0.28 11/21/2016 0917      Impression and Plan: Angel [name] is a very pleasant 56 yo African American female with an elevated lymphocyte count and surprisingly a normal flow cytometry. Her counts remain stable other than the lymphocyte % of 73.1.  She continues to be asymptomatic and has no complaints at this time.  We will continue to follow along with her and monitor her blood work. We will plan to see her back again in another 4 months.  Greater than 50% of her 15 minute face to face appointment was spent counseling and coordinating care.  She will contact our office with any questions or concerns. We can certainly see her sooner if need be.   Eliezer Bottom, NP 10/26/20189:55 AM

## 2017-03-27 ENCOUNTER — Ambulatory Visit (INDEPENDENT_AMBULATORY_CARE_PROVIDER_SITE_OTHER): Payer: BLUE CROSS/BLUE SHIELD | Admitting: Sports Medicine

## 2017-03-27 DIAGNOSIS — M4802 Spinal stenosis, cervical region: Secondary | ICD-10-CM | POA: Diagnosis not present

## 2017-03-27 MED ORDER — DULOXETINE HCL 60 MG PO CPEP: 60.0000 mg | ORAL_CAPSULE | Freq: Every day | ORAL | 3 refills | Status: DC

## 2017-03-27 NOTE — Assessment & Plan Note (Signed)
There is mild central canal stenosis with C3-C6 degenerative changes, she has been through physical therapy, 3 epidurals as well as maximum dose gabapentin without efficacy, epidurals did not provide pain relief, bilateral C4-C7 facet joint injections only provided temporary pain relief.  We switched from gabapentin to Lyrica, Lyrica was not covered so we then switched to Cymbalta, but she is actually improved considerably on Cymbalta 30, neck pain has all but resolved, she is still having some radiating pain down to the left shoulder, increasing Cymbalta to 60 mg. Return in 1 month.

## 2017-03-27 NOTE — Progress Notes (Signed)
  Subjective:    CC: Follow-up  HPI: Angel [name] returns, she has cervical spondylosis, initially she did not respond to gabapentin, more recently retried Lyrica which was not covered, ultimately the insurance company requested Cymbalta, this was started at 30 mg and she returns today with improvement in her neck pain, still has some periscapular paresthesias as well as paresthesias over the deltoid but happy with how things are going, no adverse effects.  Past medical history:  Negative.  See flowsheet/record as well for more information.  Surgical history: Negative.  See flowsheet/record as well for more information.  Family history: Negative.  See flowsheet/record as well for more information.  Social history: Negative.  See flowsheet/record as well for more information.  Allergies, and medications have been entered into the medical record, reviewed, and no changes needed.   Review of Systems: No fevers, chills, night sweats, weight loss, chest pain, or shortness of breath.   Objective:    General: Well Developed, well nourished, and in no acute distress.  Neuro: Alert and oriented x3, extra-ocular muscles intact, sensation grossly intact.  HEENT: Normocephalic, atraumatic, pupils equal round reactive to light, neck supple, no masses, no lymphadenopathy, thyroid nonpalpable.  Skin: Warm and dry, no rashes. Cardiac: Regular rate and rhythm, no murmurs rubs or gallops, no lower extremity edema.  Respiratory: Clear to auscultation bilaterally. Not using accessory muscles, speaking in full sentences. Neck: Negative spurling's Full neck range of motion Grip strength and sensation normal in bilateral hands Strength good C4 to T1 distribution No sensory change to C4 to T1 Reflexes normal  Impression and Recommendations:    Cervical spinal stenosis There is mild central canal stenosis with C3-C6 degenerative changes, she has been through physical therapy, 3 epidurals as well as maximum dose  gabapentin without efficacy, epidurals did not provide pain relief, bilateral C4-C7 facet joint injections only provided temporary pain relief.  We switched from gabapentin to Lyrica, Lyrica was not covered so we then switched to Cymbalta, but she is actually improved considerably on Cymbalta 30, neck pain has all but resolved, she is still having some radiating pain down to the left shoulder, increasing Cymbalta to 60 mg. Return in 1 month.  I spent 25 minutes with this patient, greater than 50% was face-to-face time counseling regarding the above diagnoses ___________________________________________ Gwen Her. Dianah Field, M.D., ABFM., CAQSM. Primary Care and [place] Instructor of [place] of [facility] of Medicine

## 2017-04-15 ENCOUNTER — Other Ambulatory Visit: Payer: Self-pay | Admitting: Osteopathic Medicine

## 2017-04-24 ENCOUNTER — Ambulatory Visit: Payer: BLUE CROSS/BLUE SHIELD | Admitting: Sports Medicine

## 2017-04-24 ENCOUNTER — Encounter: Payer: Self-pay | Admitting: Sports Medicine

## 2017-04-24 DIAGNOSIS — M4802 Spinal stenosis, cervical region: Secondary | ICD-10-CM | POA: Diagnosis not present

## 2017-04-24 MED ORDER — PREGABALIN 75 MG PO CAPS: 75.0000 mg | ORAL_CAPSULE | Freq: Two times a day (BID) | ORAL | 3 refills | Status: DC

## 2017-04-24 MED ORDER — DULOXETINE HCL 30 MG PO CPEP: ORAL_CAPSULE | ORAL | 3 refills | Status: DC

## 2017-04-24 NOTE — Assessment & Plan Note (Addendum)
Mild central canal stenosis at C3-C6, been through physical therapy, 3 epidurals, maximum dose gabapentin without efficacy. Epidural did not provide pain relief, bilateral C4-C7 facet joint injections provided temporary pain relief. Switched from gabapentin to Lyrica, Lyrica was not covered, insurance company requested Cymbalta first, Cymbalta 30 was helpful but at 60 she has not noted improvements. At this point I am going to take another stab at Lyrica, 75 twice a day. She will decrease her Cymbalta back to 30 mg and experiment with once a day or twice a day. Return to see me in 1-2 months after experimenting with combinations of the above medications.

## 2017-04-24 NOTE — Progress Notes (Addendum)
  Subjective:    CC: Neck pain  HPI: Angel [name] returns, she has mild multilevel cervical degenerative disc disease, we have tried multiple interventions, as listed below with minimal relief.  Currently trying to find a good combination of medications for [name].  Past medical history:  Negative.  See flowsheet/record as well for more information.  Surgical history: Negative.  See flowsheet/record as well for more information.  Family history: Negative.  See flowsheet/record as well for more information.  Social history: Negative.  See flowsheet/record as well for more information.  Allergies, and medications have been entered into the medical record, reviewed, and no changes needed.   Review of Systems: No fevers, chills, night sweats, weight loss, chest pain, or shortness of breath.   Objective:    General: Well Developed, well nourished, and in no acute distress.  Neuro: Alert and oriented x3, extra-ocular muscles intact, sensation grossly intact.  HEENT: Normocephalic, atraumatic, pupils equal round reactive to light, neck supple, no masses, no lymphadenopathy, thyroid nonpalpable.  Skin: Warm and dry, no rashes. Cardiac: Regular rate and rhythm, no murmurs rubs or gallops, no lower extremity edema.  Respiratory: Clear to auscultation bilaterally. Not using accessory muscles, speaking in full sentences.  Impression and Recommendations:    Cervical spinal stenosis Mild central canal stenosis at C3-C6, been through physical therapy, 3 epidurals, maximum dose gabapentin without efficacy. Epidural did not provide pain relief, bilateral C4-C7 facet joint injections provided temporary pain relief. Switched from gabapentin to Lyrica, Lyrica was not covered, insurance company requested Cymbalta first, Cymbalta 30 was helpful but at 60 she has not noted improvements. At this point I am going to take another stab at Lyrica, 75 twice a day. She will decrease [name] Cymbalta back to 30 mg and experiment  with once a day or twice a day. Return to see me in 1-2 months after experimenting with combinations of the above medications.  I spent 25 minutes with this patient, greater than 50% was face-to-face time counseling regarding the above diagnoses ___________________________________________ Angel [name]. Dianah Field, M.D., ABFM., CAQSM. Primary Care and [place] Instructor of [place] of [facility] of Medicine

## 2017-04-26 ENCOUNTER — Telehealth: Payer: Self-pay | Admitting: *Deleted

## 2017-04-26 NOTE — Telephone Encounter (Signed)
Unfortunately, Lyrica has once again been denied stating patient has only tried one of the alternative gabapentin. As was stated on the PA form sent to them patient has tried gabapentin and duloxetine. Appeal will be sent(.Perhaps because I inputted on the  PA form   cymbalta was tried and failed  and not specifically state it was  generic duloxetine??)  Appeal faxed.

## 2017-04-26 NOTE — Telephone Encounter (Signed)
Pre Authorization sent to cover my meds.[id number]

## 2017-04-30 ENCOUNTER — Other Ambulatory Visit: Payer: Self-pay | Admitting: Sports Medicine

## 2017-05-03 NOTE — Telephone Encounter (Signed)
The appeal has been denied. Letter sates Lyrica is approved to treat members with peripheral neuropathy due to diabetes or spinal cord injury, post-herpetic neuralgia,partial onset seizures, or fibromyaglia

## 2017-05-04 DIAGNOSIS — H401111 Primary open-angle glaucoma, right eye, mild stage: Secondary | ICD-10-CM | POA: Diagnosis not present

## 2017-05-08 NOTE — Telephone Encounter (Signed)
Looks like at this point she will need to pay out of pocket or we can discuss alternatives at an office visit with me or Dr. Darene Lamer.   Clinical note: Looks like no diagnosis of fibromyalgia on her list - we haven't done a full evaluation for this since she has other reasons for pain.

## 2017-05-09 NOTE — Telephone Encounter (Signed)
Patient notified and she has appts scheduled with both Dr. Sheppard Coil and Dr. Dianah Field in Jan

## 2017-05-29 ENCOUNTER — Other Ambulatory Visit: Payer: Self-pay | Admitting: Sports Medicine

## 2017-06-11 ENCOUNTER — Other Ambulatory Visit: Payer: Self-pay | Admitting: Sports Medicine

## 2017-06-15 ENCOUNTER — Encounter: Payer: Self-pay | Admitting: Osteopathic Medicine

## 2017-06-15 ENCOUNTER — Ambulatory Visit: Payer: BLUE CROSS/BLUE SHIELD | Admitting: Osteopathic Medicine

## 2017-06-15 ENCOUNTER — Ambulatory Visit (INDEPENDENT_AMBULATORY_CARE_PROVIDER_SITE_OTHER): Payer: BLUE CROSS/BLUE SHIELD | Admitting: Sports Medicine

## 2017-06-15 VITALS — BP 132/72 | HR 64 | Temp 97.9°F | Wt 126.0 lb

## 2017-06-15 DIAGNOSIS — H409 Unspecified glaucoma: Secondary | ICD-10-CM

## 2017-06-15 DIAGNOSIS — E118 Type 2 diabetes mellitus with unspecified complications: Secondary | ICD-10-CM | POA: Diagnosis not present

## 2017-06-15 DIAGNOSIS — Z1239 Encounter for other screening for malignant neoplasm of breast: Secondary | ICD-10-CM

## 2017-06-15 DIAGNOSIS — F17200 Nicotine dependence, unspecified, uncomplicated: Secondary | ICD-10-CM | POA: Diagnosis not present

## 2017-06-15 DIAGNOSIS — Z1231 Encounter for screening mammogram for malignant neoplasm of breast: Secondary | ICD-10-CM | POA: Diagnosis not present

## 2017-06-15 DIAGNOSIS — Z794 Long term (current) use of insulin: Secondary | ICD-10-CM | POA: Diagnosis not present

## 2017-06-15 DIAGNOSIS — M4802 Spinal stenosis, cervical region: Secondary | ICD-10-CM

## 2017-06-15 LAB — POCT GLYCOSYLATED HEMOGLOBIN (HGB A1C): Hemoglobin A1C: 6.5

## 2017-06-15 MED ORDER — TIZANIDINE HCL 4 MG PO TABS: 4.0000 mg | ORAL_TABLET | Freq: Every day | ORAL | 3 refills | Status: DC

## 2017-06-15 MED ORDER — DULOXETINE HCL 60 MG PO CPEP: ORAL_CAPSULE | ORAL | 11 refills | Status: DC

## 2017-06-15 NOTE — Progress Notes (Signed)
HPI: Angel [name] is a 57 y.o. female  who presents to [place] today, 06/15/17,  for chief complaint of:  Chief Complaint  Patient presents with  . Diabetes    DIABETES SCREENING/PREVENTIVE CARE: A1C and meds/weight  05/08/16: 7.9%  09/04/16: 8.1%  12/13/16: 7.6% on Lantus 15 daily, saxagliptin 5 daily  03/15/17: 8.2% Wt 133 lb. She has been cheating on diet a little bit, no meds adjustments made, diet counselled  Today 06/15/17: 6.5% Wt 126 lb. Cost of saxagliptin has gone up and she would like to discuss alternative treatments. We stopped the Saxagliptin since A1C and weight were good.  BP goal <130/80: Yes, 132/72 on 06/15/17  LDL goal <70: close! 74 in 08/2016 Eye exam annually: none on file, importance discussed with patient , she is following w/ ophtho for glaucoma Foot exam: Yes 12/13/16  Microalbuminuria: n/a Metformin: No - intolerant ACE/ARB: Yes  Antiplatelet if ASCVD Risk >10%: Yes  Statin: declined  Pneumovax: Yes 09/04/16  Hx abnormal  WBC, following with HemOnc last seen 02/2017 and records reviewed, elevated WBC w/ normal flow cytometry   Glaucoma: Following with ophthalmology.  Preventive care discussed: Patient would like to hold off on colonoscopy or colon cancer screening at this point, will think about the Cologuard testing. She is okay to go ahead and get a mammogram done, orders were placed.   Past medical history, surgical history, social history and family history reviewed.  Patient Active Problem List   Diagnosis Date Noted  . Lymphocytosis 11/08/2016  . Numbness and tingling in both hands 09/04/2016  . Primary osteoarthritis, left shoulder 05/23/2016  . Cervical spinal stenosis 12/13/2015  . Acne vulgaris 08/03/2014  . Diabetes mellitus type 2 in nonobese ([place]) 03/11/2013  . Essential hypertension 03/11/2013  . Glaucoma (increased eye pressure) 03/11/2013  . Menopausal and postmenopausal disorder  03/11/2013  . IDDM (insulin dependent diabetes mellitus) ([place]) 02/23/2012  . NS (nuclear sclerosis) 02/23/2012  . Primary open angle glaucoma 02/23/2012    Current medication list and allergy/intolerance information reviewed.   Current Outpatient Medications on File Prior to Visit  Medication Sig Dispense Refill  . aspirin 81 MG chewable tablet Chew 81 mg by mouth.    . Blood Glucose Monitoring Suppl ([place]) w/Device KIT     . CHANTIX CONTINUING MONTH PAK 1 MG tablet TAKE AS DIRECTED 56 tablet 1  . Cholecalciferol (VITAMIN D3) 2000 units TABS Take by mouth.    . DULoxetine (CYMBALTA) 30 MG capsule 30 mg capsule orally 1-2 times per day 60 capsule 3  . gabapentin (NEURONTIN) 600 MG tablet Take 2 tablets (1,200 mg total) by mouth 2 (two) times daily. 120 tablet 1  . gabapentin (NEURONTIN) 600 MG tablet TAKE 2 TABLETS BY MOUTH TWICE DAILY 120 tablet 1  . Insulin Glargine (LANTUS SOLOSTAR) 100 UNIT/ML Solostar Pen Inject 15 Units into the skin daily. 3 mL 5  . latanoprost (XALATAN) 0.005 % ophthalmic solution     . losartan-hydrochlorothiazide (HYZAAR) 100-12.5 MG tablet TAKE 1 TABLET BY MOUTH ONCE DAILY 30 tablet 2  . medroxyPROGESTERone (PROVERA) 10 MG tablet TAKE 1 TABLET BY MOUTH ONCE DAILY 30 tablet 0  . pregabalin (LYRICA) 75 MG capsule Take 1 capsule (75 mg total) by mouth 2 (two) times daily. 60 capsule 3  . saxagliptin HCl (ONGLYZA) 5 MG TABS tablet Take 1 tablet (5 mg total) by mouth daily. 90 tablet 3  . timolol (TIMOPTIC) 0.5 % ophthalmic solution 0.5 mLs.    Marland Kitchen  tiZANidine (ZANAFLEX) 4 MG tablet Take 1-2 tablets (4-8 mg total) by mouth at bedtime. 60 tablet 3  . traMADol (ULTRAM) 50 MG tablet TAKE 1 TABLET BY MOUTH TWICE DAILY 60 tablet 1   No current facility-administered medications on file prior to visit.    No Known Allergies    Review of Systems:  Constitutional: No recent illness  Cardiac: No  chest pain, No  pressure  Respiratory:  No  shortness of  breath. No  Cough  Gastrointestinal: No  abdominal pain  Musculoskeletal: No new myalgia/arthralgia  Skin: No  Rash  Exam:  BP 132/72   Pulse 64   Temp 97.9 F (36.6 C) (Oral)   Wt 126 lb 0.6 oz (57.2 kg)   BMI 20.97 kg/m   Constitutional: VS see above. General Appearance: alert, well-developed, well-nourished, NAD  Eyes: Normal lids and conjunctive, non-icteric sclera  Ears, Nose, Mouth, Throat: MMM, Normal external inspection ears/nares/mouth/lips/gums.  Neck: No masses, trachea midline.   Respiratory: Normal respiratory effort. no wheeze, no rhonchi, no rales  Cardiovascular: S1/S2 normal, no murmur, no rub/gallop auscultated. RRR.   Musculoskeletal: Gait normal. Symmetric and independent movement of all extremities  Neurological: Normal balance/coordination. No tremor.  Skin: warm, dry, intact.   Psychiatric: Normal judgment/insight. Normal mood and affect. Oriented x3.   Results for orders placed or performed in visit on 06/15/17 (from the past 24 hour(s))  POCT HgB A1C     Status: None   Collection Time: 06/15/17 10:01 AM  Result Value Ref Range   Hemoglobin A1C 6.5       ASSESSMENT/PLAN:   Controlled type 2 diabetes mellitus with complication, with long-term current use of insulin ([place]) - Plan: POCT HgB A1C  Essential hypertension  Glaucoma of both eyes, unspecified glaucoma type  Tobacco dependence  Breast cancer screening - Plan: MM DIGITAL SCREENING BILATERAL      Follow-up plan: Return in about 3 months (around 09/13/2017) for A1C check, see me sooner if needed .  Visit summary with medication list and pertinent instructions was printed for patient to review, alert [place] if any changes needed. All questions at time of visit were answered - patient instructed to contact office with any additional concerns. ER/RTC precautions were reviewed with the patient and understanding verbalized.   Note: Total time spent 25 minutes, greater than 50% of the  visit was spent face-to-face counseling and coordinating care for the following: The primary encounter diagnosis was Controlled type 2 diabetes mellitus with complication, with long-term current use of insulin ([place]). Diagnoses of Essential hypertension, Glaucoma of both eyes, unspecified glaucoma type, Tobacco dependence, and Breast cancer screening were also pertinent to this visit.Marland Kitchen

## 2017-06-15 NOTE — Progress Notes (Signed)
Subjective:    CC: Neck pain  HPI: This is a pleasant 57 year old female with known multilevel cervical spondylosis with facet joint arthritis.  We have tried multiple modalities, medications, injections, therapies as noted below in the assessment and plan, she continues to have significant discomfort.  She has been doing 30 mg of Cymbalta as well, just went up to 60 today.  I reviewed the past medical history, family history, social history, surgical history, and allergies today and no changes were needed.  Please see the problem list section below in epic for further details.  Past Medical History: Past Medical History:  Diagnosis Date  . Diabetes mellitus without complication ([place])   . Glaucoma   . History of bone density study   . Hypertension    Past Surgical History: Past Surgical History:  Procedure Laterality Date  . APPENDECTOMY    . TONSILLECTOMY AND ADENOIDECTOMY    . TUBAL LIGATION     Social History: Social History   Socioeconomic History  . Marital status: Single    Spouse name: Not on file  . Number of children: Not on file  . Years of education: Not on file  . Highest education level: Not on file  Social Needs  . Financial resource strain: Not on file  . Food insecurity - worry: Not on file  . Food insecurity - inability: Not on file  . Transportation needs - medical: Not on file  . Transportation needs - non-medical: Not on file  Occupational History  . Not on file  Tobacco Use  . Smoking status: Former Research scientist (life sciences)  . Smokeless tobacco: Never Used  Substance and Sexual Activity  . Alcohol use: No  . Drug use: Not on file  . Sexual activity: Not on file  Other Topics Concern  . Not on file  Social History Narrative  . Not on file   Family History: Family History  Problem Relation Age of Onset  . Hypertension Mother   . Asthma Sister   . Hypertension Sister   . Diabetes Brother   . Hypertension Brother   . Cancer Maternal Grandfather     Allergies: No Known Allergies Medications: See med rec.  Review of Systems: No fevers, chills, night sweats, weight loss, chest pain, or shortness of breath.   Objective:    General: Well Developed, well nourished, and in no acute distress.  Neuro: Alert and oriented x3, extra-ocular muscles intact, sensation grossly intact.  HEENT: Normocephalic, atraumatic, pupils equal round reactive to light, neck supple, no masses, no lymphadenopathy, thyroid nonpalpable.  Skin: Warm and dry, no rashes. Cardiac: Regular rate and rhythm, no murmurs rubs or gallops, no lower extremity edema.  Respiratory: Clear to auscultation bilaterally. Not using accessory muscles, speaking in full sentences.  Impression and Recommendations:    Cervical spinal stenosis Mild central canal stenosis C3-C6, has been through physical therapy, 3 epidurals, maximum dose gabapentin without efficacy. Bilateral C4-C7 facet joint injections only provided minimal and temporary relief.  We have not tried radio frequency ablation. Nothing obviously operative during her visit with neurosurgery. Lyrica has not been covered. Cymbalta 30 was minimally helpful, she went up to 60 mg today.  Understands she needs to give it a solid month. She will continue Zanaflex, tramadol, after a month on 60 if she is still feeling insufficient relief she will go up to 120. I would like her to weigh in with pain management at this point as well.  I spent 25 minutes with this patient,  greater than 50% was face-to-face time counseling regarding the above diagnoses ___________________________________________ Gwen Her. Dianah Field, M.D., ABFM., CAQSM. Primary Care and [place] Instructor of [place] of [facility] of Medicine

## 2017-06-15 NOTE — Assessment & Plan Note (Signed)
Mild central canal stenosis C3-C6, has been through physical therapy, 3 epidurals, maximum dose gabapentin without efficacy. Bilateral C4-C7 facet joint injections only provided minimal and temporary relief.  We have not tried radio frequency ablation. Nothing obviously operative during her visit with neurosurgery. Lyrica has not been covered. Cymbalta 30 was minimally helpful, she went up to 60 mg today.  Understands she needs to give it a solid month. She will continue Zanaflex, tramadol, after a month on 60 if she is still feeling insufficient relief she will go up to 120. I would like her to weigh in with pain management at this point as well.

## 2017-06-20 DIAGNOSIS — H401132 Primary open-angle glaucoma, bilateral, moderate stage: Secondary | ICD-10-CM | POA: Diagnosis not present

## 2017-06-23 ENCOUNTER — Other Ambulatory Visit: Payer: Self-pay | Admitting: Sports Medicine

## 2017-06-27 ENCOUNTER — Ambulatory Visit (INDEPENDENT_AMBULATORY_CARE_PROVIDER_SITE_OTHER): Payer: BLUE CROSS/BLUE SHIELD

## 2017-06-27 DIAGNOSIS — Z1231 Encounter for screening mammogram for malignant neoplasm of breast: Secondary | ICD-10-CM

## 2017-07-08 ENCOUNTER — Other Ambulatory Visit: Payer: Self-pay | Admitting: Osteopathic Medicine

## 2017-07-24 ENCOUNTER — Encounter: Payer: Self-pay | Admitting: Family

## 2017-07-24 ENCOUNTER — Other Ambulatory Visit: Payer: Self-pay

## 2017-07-24 ENCOUNTER — Inpatient Hospital Stay: Payer: BLUE CROSS/BLUE SHIELD

## 2017-07-24 ENCOUNTER — Inpatient Hospital Stay: Payer: BLUE CROSS/BLUE SHIELD | Attending: Family | Admitting: Family

## 2017-07-24 VITALS — BP 132/71 | HR 74 | Temp 97.9°F | Resp 17 | Wt 126.0 lb

## 2017-07-24 DIAGNOSIS — Z794 Long term (current) use of insulin: Secondary | ICD-10-CM | POA: Insufficient documentation

## 2017-07-24 DIAGNOSIS — D72820 Lymphocytosis (symptomatic): Secondary | ICD-10-CM | POA: Insufficient documentation

## 2017-07-24 DIAGNOSIS — Z7982 Long term (current) use of aspirin: Secondary | ICD-10-CM | POA: Insufficient documentation

## 2017-07-24 DIAGNOSIS — Z79899 Other long term (current) drug therapy: Secondary | ICD-10-CM | POA: Insufficient documentation

## 2017-07-24 DIAGNOSIS — E119 Type 2 diabetes mellitus without complications: Secondary | ICD-10-CM | POA: Diagnosis not present

## 2017-07-24 LAB — CBC WITH DIFFERENTIAL (CANCER CENTER ONLY)
Basophils Absolute: 0 10*3/uL (ref 0.0–0.1)
Basophils Relative: 0 %
EOS ABS: 0.1 10*3/uL (ref 0.0–0.5)
EOS PCT: 1 %
HCT: 34.3 % — ABNORMAL LOW (ref 34.8–46.6)
Hemoglobin: 11.3 g/dL — ABNORMAL LOW (ref 11.6–15.9)
LYMPHS ABS: 9 10*3/uL — AB (ref 0.9–3.3)
LYMPHS PCT: 78 %
MCH: 31.1 pg (ref 26.0–34.0)
MCHC: 32.9 g/dL (ref 32.0–36.0)
MCV: 94.5 fL (ref 81.0–101.0)
MONOS PCT: 5 %
Monocytes Absolute: 0.5 10*3/uL (ref 0.1–0.9)
Neutro Abs: 1.9 10*3/uL (ref 1.5–6.5)
Neutrophils Relative %: 16 %
Platelet Count: 195 10*3/uL (ref 145–400)
RBC: 3.63 MIL/uL — ABNORMAL LOW (ref 3.70–5.32)
RDW: 13.5 % (ref 11.1–15.7)
WBC Count: 11.5 10*3/uL — ABNORMAL HIGH (ref 3.9–10.0)

## 2017-07-24 LAB — CMP (CANCER CENTER ONLY)
ALT: 33 U/L (ref 0–55)
ANION GAP: 10 (ref 3–11)
AST: 36 U/L — ABNORMAL HIGH (ref 5–34)
Albumin: 4 g/dL (ref 3.5–5.0)
Alkaline Phosphatase: 102 U/L (ref 40–150)
BUN: 9 mg/dL (ref 7–26)
CO2: 25 mmol/L (ref 22–29)
Calcium: 9.2 mg/dL (ref 8.4–10.4)
Chloride: 105 mmol/L (ref 98–109)
Creatinine: 0.98 mg/dL (ref 0.60–1.10)
Glucose, Bld: 163 mg/dL — ABNORMAL HIGH (ref 70–140)
Potassium: 4.3 mmol/L (ref 3.5–5.1)
SODIUM: 140 mmol/L (ref 136–145)
TOTAL PROTEIN: 6.5 g/dL (ref 6.4–8.3)
Total Bilirubin: 0.3 mg/dL (ref 0.2–1.2)

## 2017-07-24 NOTE — Progress Notes (Signed)
Hematology and Oncology Follow Up Visit  Angel [name] [id number] April 04, 1961 57 y.o. 07/24/2017   Principle Diagnosis:  Lymphoproliferative process - flow cytometry normal  Current Therapy:   Observation   Interim History:  Angel [name] is here today with her mother for follow-up. She continues to do well and has no complaints at this time. WBC count is stable at 11.5, Hgb 11.3 and platelet count 195. Lymphocyte % 78.  No lymphadenopathy found on exam.  She has had no fever, chills, n/v, cough, rash, dizziness, SOB, chest pain, palpitations, abdominal pain or changes in bowel or bladder habits.  No bleeding, bruising or petechiae.  No swelling, tenderness, numbness or tingling in her extremities. No c/o pain.  She has maintained a good appetite and is staying well hydrated. Her weight is stable.   ECOG Performance Status: 0 - Asymptomatic  Medications:  Allergies as of 07/24/2017   No Known Allergies     Medication List        Accurate as of 07/24/17 10:50 AM. Always use your most recent med list.          aspirin 81 MG chewable tablet Chew 81 mg by mouth.   [place] w/Device Kit   DULoxetine 60 MG capsule Commonly known as:  CYMBALTA 60 mg daily for a month, may increase to 120 mg daily if insufficient response   gabapentin 600 MG tablet Commonly known as:  NEURONTIN TAKE 2 TABLETS BY MOUTH TWICE DAILY   Insulin Glargine 100 UNIT/ML Solostar Pen Commonly known as:  LANTUS SOLOSTAR Inject 15 Units into the skin daily.   latanoprost 0.005 % ophthalmic solution Commonly known as:  XALATAN   losartan-hydrochlorothiazide 100-12.5 MG tablet Commonly known as:  HYZAAR TAKE 1 TABLET BY MOUTH ONCE DAILY   timolol 0.5 % ophthalmic solution Commonly known as:  TIMOPTIC 0.5 mLs.   tiZANidine 4 MG tablet Commonly known as:  ZANAFLEX Take 1-2 tablets (4-8 mg total) by mouth at bedtime.   traMADol 50 MG tablet Commonly known as:  ULTRAM TAKE 1 TABLET BY  MOUTH TWICE DAILY   Vitamin D3 2000 units Tabs Take by mouth.       Allergies: No Known Allergies  Past Medical History, Surgical history, Social history, and Family History were reviewed and updated.  Review of Systems: All other 10 point review of systems is negative.   Physical Exam:  weight is 126 lb (57.2 kg). Her oral temperature is 97.9 F (36.6 C). Her blood pressure is 132/71 and her pulse is 74. Her respiration is 17 and oxygen saturation is 100%.   Wt Readings from Last 3 Encounters:  07/24/17 126 lb (57.2 kg)  06/15/17 126 lb 0.6 oz (57.2 kg)  04/24/17 131 lb (59.4 kg)    Ocular: Sclerae unicteric, pupils equal, round and reactive to light Ear-nose-throat: Oropharynx clear, dentition fair Lymphatic: No cervical, supraclavicular or axillary adenopathy Lungs no rales or rhonchi, good excursion bilaterally Heart regular rate and rhythm, no murmur appreciated Abd soft, nontender, positive bowel sounds, no liver or spleen tip palpated on exam, no fluid wave  MSK no focal spinal tenderness, no joint edema Neuro: non-focal, well-oriented, appropriate affect Breasts: Deferred   Lab Results  Component Value Date   WBC 11.5 (H) 07/24/2017   HGB 11.6 03/23/2017   HCT 34.3 (L) 07/24/2017   MCV 94.5 07/24/2017   PLT 195 07/24/2017   Lab Results  Component Value Date   FERRITIN 70 03/23/2017   IRON 100 03/23/2017  TIBC 309 03/23/2017   UIBC 208 03/23/2017   IRONPCTSAT 32 03/23/2017   Lab Results  Component Value Date   RBC 3.63 (L) 07/24/2017   No results found for: KPAFRELGTCHN, LAMBDASER, KAPLAMBRATIO No results found for: IGGSERUM, IGA, IGMSERUM No results found for: Odetta Pink, SPEI   Chemistry      Component Value Date/Time   NA 141 03/23/2017 0838   K 4.3 03/23/2017 0838   CL 100 09/04/2016 0917   CO2 25 03/23/2017 0838   BUN 11.3 03/23/2017 0838   CREATININE 1.0 03/23/2017 0838        Component Value Date/Time   CALCIUM 9.3 03/23/2017 0838   ALKPHOS 98 03/23/2017 0838   AST 33 03/23/2017 0838   ALT 28 03/23/2017 0838   BILITOT 0.39 03/23/2017 0838      Impression and Plan: Angel [name] is a very pleasant 57 yo African American female with an elevated lymphocyte count and normal flow cytometry.  We continue to follow along with her monitoring her counts.  She continues to do well and has no complaints at this time. Her counts remain stable.  We will plan to see her back in another 6 months for follow-up.  They will contact our office with any questions or concerns. We can certainly see her sooner if need be.   Laverna Peace, NP 2/26/201910:50 AM

## 2017-08-03 ENCOUNTER — Other Ambulatory Visit: Payer: Self-pay | Admitting: Sports Medicine

## 2017-08-13 ENCOUNTER — Ambulatory Visit: Payer: BLUE CROSS/BLUE SHIELD | Admitting: Sports Medicine

## 2017-08-13 ENCOUNTER — Encounter: Payer: Self-pay | Admitting: Sports Medicine

## 2017-08-13 DIAGNOSIS — M4802 Spinal stenosis, cervical region: Secondary | ICD-10-CM

## 2017-08-13 MED ORDER — DULOXETINE HCL 30 MG PO CPEP: 30.0000 mg | ORAL_CAPSULE | Freq: Every day | ORAL | 3 refills | Status: DC

## 2017-08-13 MED ORDER — DULOXETINE HCL 60 MG PO CPEP: 60.0000 mg | ORAL_CAPSULE | Freq: Every day | ORAL | 3 refills | Status: AC

## 2017-08-13 NOTE — Assessment & Plan Note (Signed)
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  No efficacy from the interventions, gabapentin does provide good efficacy. Has had bilateral C4-C7 facet joint injections that provided only minimal temporary relief, no radio frequency ablation. Nothing operative seen during neurosurgery consult. Lyrica not covered. Cymbalta 60 has been highly effective, increasing to 90 mg daily. She will continue Zanaflex, tramadol. Overall happy with current results. At this point I do not think we need pain management.

## 2017-08-13 NOTE — Progress Notes (Signed)
  Subjective:    CC: Follow-up  HPI: This is a pleasant 57 year old female, she has multilevel cervical spondylosis, we have tried multiple interventions, epidurals and facet injections, ultimately she is done well with tramadol, Zanaflex, gabapentin, Cymbalta.  She recently went up to 90 mg of Cymbalta and is happy with the results.  I reviewed the past medical history, family history, social history, surgical history, and allergies today and no changes were needed.  Please see the problem list section below in epic for further details.  Past Medical History: Past Medical History:  Diagnosis Date  . Diabetes mellitus without complication ([place])   . Glaucoma   . History of bone density study   . Hypertension    Past Surgical History: Past Surgical History:  Procedure Laterality Date  . APPENDECTOMY    . TONSILLECTOMY AND ADENOIDECTOMY    . TUBAL LIGATION     Social History: Social History   Socioeconomic History  . Marital status: Single    Spouse name: None  . Number of children: None  . Years of education: None  . Highest education level: None  Social Needs  . Financial resource strain: None  . Food insecurity - worry: None  . Food insecurity - inability: None  . Transportation needs - medical: None  . Transportation needs - non-medical: None  Occupational History  . None  Tobacco Use  . Smoking status: Former Research scientist (life sciences)  . Smokeless tobacco: Never Used  Substance and Sexual Activity  . Alcohol use: No  . Drug use: None  . Sexual activity: None  Other Topics Concern  . None  Social History Narrative  . None   Family History: Family History  Problem Relation Age of Onset  . Hypertension Mother   . Asthma Sister   . Hypertension Sister   . Diabetes Brother   . Hypertension Brother   . Cancer Maternal Grandfather    Allergies: No Known Allergies Medications: See med rec.  Review of Systems: No fevers, chills, night sweats, weight loss, chest pain, or  shortness of breath.   Objective:    General: Well Developed, well nourished, and in no acute distress.  Neuro: Alert and oriented x3, extra-ocular muscles intact, sensation grossly intact.  HEENT: Normocephalic, atraumatic, pupils equal round reactive to light, neck supple, no masses, no lymphadenopathy, thyroid nonpalpable.  Skin: Warm and dry, no rashes. Cardiac: Regular rate and rhythm, no murmurs rubs or gallops, no lower extremity edema.  Respiratory: Clear to auscultation bilaterally. Not using accessory muscles, speaking in full sentences.  Impression and Recommendations:    Cervical spinal stenosis Mild central canal stenosis from C3-C6, has been through physical therapy, 3 epidurals and maximum dose gabapentin.  No efficacy from the interventions, gabapentin does provide good efficacy. Has had bilateral C4-C7 facet joint injections that provided only minimal temporary relief, no radio frequency ablation. Nothing operative seen during neurosurgery consult. Lyrica not covered. Cymbalta 60 has been highly effective, increasing to 90 mg daily. She will continue Zanaflex, tramadol. Overall happy with current results. At this point I do not think we need pain management.  I spent 25 minutes with this patient, greater than 50% was face-to-face time counseling regarding the above diagnoses  ___________________________________________ Gwen Her. Dianah Field, M.D., ABFM., CAQSM. Primary Care and [place] Instructor of [place] of [facility] of Medicine

## 2017-08-14 ENCOUNTER — Other Ambulatory Visit: Payer: Self-pay | Admitting: Sports Medicine

## 2017-08-14 ENCOUNTER — Other Ambulatory Visit: Payer: Self-pay | Admitting: Osteopathic Medicine

## 2017-09-13 ENCOUNTER — Ambulatory Visit: Payer: BLUE CROSS/BLUE SHIELD | Admitting: Osteopathic Medicine

## 2017-09-13 ENCOUNTER — Encounter: Payer: Self-pay | Admitting: Osteopathic Medicine

## 2017-09-13 VITALS — BP 165/81 | HR 57 | Temp 98.0°F | Wt 132.5 lb

## 2017-09-13 DIAGNOSIS — Z794 Long term (current) use of insulin: Secondary | ICD-10-CM | POA: Diagnosis not present

## 2017-09-13 DIAGNOSIS — E118 Type 2 diabetes mellitus with unspecified complications: Secondary | ICD-10-CM | POA: Diagnosis not present

## 2017-09-13 DIAGNOSIS — I10 Essential (primary) hypertension: Secondary | ICD-10-CM

## 2017-09-13 LAB — POCT GLYCOSYLATED HEMOGLOBIN (HGB A1C): Hemoglobin A1C: 7.4

## 2017-09-13 NOTE — Progress Notes (Signed)
HPI: Angel [name] is a 57 y.o. female  who presents to [facility] today, 09/13/17,  for chief complaint of:   DM2  HTN   DIABETES SCREENING/PREVENTIVE CARE: A1C and meds/weight  05/08/16: 7.9%  09/04/16: 8.1%  12/13/16: 7.6% on Lantus 15 daily, saxagliptin 5 daily  03/15/17: 8.2% Wt 133 lb. She has been cheating on diet a little bit, no meds adjustments made, diet counselled  06/15/17: 6.5% Wt 126 lb. Cost of saxagliptin has gone up and she would like to discuss alternative treatments. We stopped the Saxagliptin since A1C and weight were good.   Today, 09/13/17: declined A1C initially, concerned it would be high. Wt 132. I convinced her to get the A1C which was 7.4%, not as bad as we thought!  BP goal <130/80: Not today, she reports she has just come from a dental visit and is in pain with her neck  LDL goal <70: close! 74 in 08/2016 Eye exam annually: none on file, importance discussed with patient , she is following w/ ophtho for glaucoma Foot exam: Yes 12/13/16  Microalbuminuria: n/a Metformin: No - intolerant ACE/ARB: Yes  Antiplatelet if ASCVD Risk >10%: Yes  Statin: declined  Pneumovax: Yes 09/04/16  HTN: BP elevated today, no CP/SOB, no HA/VC, pt repots neck pain and asks if Dr T might be available to take a look   Preventive care discussed: Patient would like to hold off on colonoscopy or other colon cancer screening at this point, will think about the Cologuard testing. She is okay to go ahead and get a mammogram done, orders were placed.   Past medical history, surgical history, social history and family history reviewed.  Patient Active Problem List   Diagnosis Date Noted  . Lymphocytosis 11/08/2016  . Numbness and tingling in both hands 09/04/2016  . Primary osteoarthritis, left shoulder 05/23/2016  . Cervical spinal stenosis 12/13/2015  . Acne vulgaris 08/03/2014  . Diabetes mellitus type 2 in nonobese ([place]) 03/11/2013   . Essential hypertension 03/11/2013  . Glaucoma (increased eye pressure) 03/11/2013  . Menopausal and postmenopausal disorder 03/11/2013  . IDDM (insulin dependent diabetes mellitus) ([place]) 02/23/2012  . NS (nuclear sclerosis) 02/23/2012  . Primary open angle glaucoma 02/23/2012    Current medication list and allergy/intolerance information reviewed.   Current Outpatient Medications on File Prior to Visit  Medication Sig Dispense Refill  . aspirin 81 MG chewable tablet Chew 81 mg by mouth.    . Blood Glucose Monitoring Suppl ([place]) w/Device KIT     . Cholecalciferol (VITAMIN D3) 2000 units TABS Take by mouth.    . DULoxetine (CYMBALTA) 30 MG capsule Take 1 capsule (30 mg total) by mouth daily. For use with 60 mg capsules 90 capsule 3  . DULoxetine (CYMBALTA) 60 MG capsule Take 1 capsule (60 mg total) by mouth daily. For use with 30 mg capsules 90 capsule 3  . gabapentin (NEURONTIN) 600 MG tablet TAKE 2 TABLETS BY MOUTH TWICE DAILY 120 tablet 1  . Insulin Glargine (LANTUS SOLOSTAR) 100 UNIT/ML Solostar Pen Inject 15 Units into the skin daily. 3 mL 5  . latanoprost (XALATAN) 0.005 % ophthalmic solution     . losartan-hydrochlorothiazide (HYZAAR) 100-12.5 MG tablet TAKE 1 TABLET BY MOUTH ONCE DAILY 90 tablet 0  . timolol (TIMOPTIC) 0.5 % ophthalmic solution 0.5 mLs.    [place] tiZANidine (ZANAFLEX) 4 MG tablet Take 1-2 tablets (4-8 mg total) by mouth at bedtime. 60 tablet 3  . traMADol (ULTRAM)  50 MG tablet TAKE 1 TABLET BY MOUTH TWICE DAILY 60 tablet 1   No current facility-administered medications on file prior to visit.    No Known Allergies    Review of Systems:  Constitutional: No recent illness  Cardiac: No  chest pain, No  pressure  Respiratory:  No  shortness of breath. No  Cough  Gastrointestinal: No  abdominal pain  Musculoskeletal: +myalgia/arthralgia  Skin: No  Rash  Exam:  BP (!) 165/81 (BP Location: Right Arm, Patient Position: Sitting, Cuff Size:  Normal)   Pulse (!) 57   Temp 98 F (36.7 C) (Oral)   Wt 132 lb 8 oz (60.1 kg)   BMI 22.05 kg/m   Constitutional: VS see above. General Appearance: alert, well-developed, well-nourished, NAD  Eyes: Normal lids and conjunctive, non-icteric sclera  Ears, Nose, Mouth, Throat: MMM, Normal external inspection ears/nares/mouth/lips/gums.  Neck: No masses, trachea midline.   Respiratory: Normal respiratory effort. no wheeze, no rhonchi, no rales  Cardiovascular: S1/S2 normal, no murmur, no rub/gallop auscultated. RRR.   Musculoskeletal: Gait normal. Symmetric and independent movement of all extremities  Neurological: Normal balance/coordination. No tremor.  Skin: warm, dry, intact.   Psychiatric: Normal judgment/insight. Normal mood and affect. Oriented x3.   Results for orders placed or performed in visit on 09/13/17 (from the past 24 hour(s))  POCT HgB A1C     Status: None   Collection Time: 09/13/17  9:59 AM  Result Value Ref Range   Hemoglobin A1C 7.4       ASSESSMENT/PLAN:   Controlled type 2 diabetes mellitus with complication, with long-term current use of insulin (HCC) - diet control emphasized with patient, if A1C not better next time will need to increase insulin  - Plan: POCT HgB A1C  Essential hypertension - reports high today bc of pain, would like to see Dr T if possible        Follow-up plan: Return in about 3 months (around 12/13/2017) for recheck A1C, sooner if needed .  Visit summary with medication list and pertinent instructions was printed for patient to review, alert [place] if any changes needed. All questions at time of visit were answered - patient instructed to contact office with any additional concerns. ER/RTC precautions were reviewed with the patient and understanding verbalized.

## 2017-10-05 ENCOUNTER — Other Ambulatory Visit: Payer: Self-pay | Admitting: Sports Medicine

## 2017-10-10 DIAGNOSIS — E1136 Type 2 diabetes mellitus with diabetic cataract: Secondary | ICD-10-CM | POA: Diagnosis not present

## 2017-10-10 DIAGNOSIS — H02403 Unspecified ptosis of bilateral eyelids: Secondary | ICD-10-CM | POA: Diagnosis not present

## 2017-10-10 DIAGNOSIS — H401190 Primary open-angle glaucoma, unspecified eye, stage unspecified: Secondary | ICD-10-CM | POA: Diagnosis not present

## 2017-10-10 DIAGNOSIS — E1139 Type 2 diabetes mellitus with other diabetic ophthalmic complication: Secondary | ICD-10-CM | POA: Diagnosis not present

## 2017-10-10 DIAGNOSIS — H401111 Primary open-angle glaucoma, right eye, mild stage: Secondary | ICD-10-CM | POA: Diagnosis not present

## 2017-10-10 DIAGNOSIS — Z794 Long term (current) use of insulin: Secondary | ICD-10-CM | POA: Diagnosis not present

## 2017-10-14 ENCOUNTER — Other Ambulatory Visit: Payer: Self-pay | Admitting: Sports Medicine

## 2017-10-23 ENCOUNTER — Other Ambulatory Visit: Payer: Self-pay | Admitting: Osteopathic Medicine

## 2017-10-23 ENCOUNTER — Other Ambulatory Visit: Payer: Self-pay | Admitting: Sports Medicine

## 2017-12-04 ENCOUNTER — Other Ambulatory Visit: Payer: Self-pay | Admitting: Sports Medicine

## 2017-12-05 NOTE — Telephone Encounter (Signed)
[facility] requesting RF on pt's Tramadol  Last RF sent 10-05-17 for 30 day supply with 1 RF  RX pended, please review and send if appropriate   Thanks!

## 2017-12-11 ENCOUNTER — Other Ambulatory Visit: Payer: Self-pay | Admitting: Sports Medicine

## 2017-12-11 DIAGNOSIS — H401132 Primary open-angle glaucoma, bilateral, moderate stage: Secondary | ICD-10-CM | POA: Diagnosis not present

## 2017-12-12 ENCOUNTER — Ambulatory Visit (INDEPENDENT_AMBULATORY_CARE_PROVIDER_SITE_OTHER): Payer: BLUE CROSS/BLUE SHIELD

## 2017-12-12 ENCOUNTER — Ambulatory Visit: Payer: BLUE CROSS/BLUE SHIELD | Admitting: Osteopathic Medicine

## 2017-12-12 ENCOUNTER — Encounter: Payer: Self-pay | Admitting: Osteopathic Medicine

## 2017-12-12 VITALS — BP 152/91 | HR 60 | Temp 98.2°F | Wt 135.3 lb

## 2017-12-12 DIAGNOSIS — M4802 Spinal stenosis, cervical region: Secondary | ICD-10-CM | POA: Diagnosis not present

## 2017-12-12 DIAGNOSIS — R300 Dysuria: Secondary | ICD-10-CM | POA: Diagnosis not present

## 2017-12-12 DIAGNOSIS — R0789 Other chest pain: Secondary | ICD-10-CM | POA: Diagnosis not present

## 2017-12-12 DIAGNOSIS — N3090 Cystitis, unspecified without hematuria: Secondary | ICD-10-CM

## 2017-12-12 DIAGNOSIS — E11649 Type 2 diabetes mellitus with hypoglycemia without coma: Secondary | ICD-10-CM | POA: Diagnosis not present

## 2017-12-12 DIAGNOSIS — B373 Candidiasis of vulva and vagina: Secondary | ICD-10-CM | POA: Diagnosis not present

## 2017-12-12 DIAGNOSIS — I10 Essential (primary) hypertension: Secondary | ICD-10-CM

## 2017-12-12 DIAGNOSIS — R05 Cough: Secondary | ICD-10-CM | POA: Diagnosis not present

## 2017-12-12 DIAGNOSIS — J8410 Pulmonary fibrosis, unspecified: Secondary | ICD-10-CM | POA: Diagnosis not present

## 2017-12-12 DIAGNOSIS — K861 Other chronic pancreatitis: Secondary | ICD-10-CM

## 2017-12-12 DIAGNOSIS — M19012 Primary osteoarthritis, left shoulder: Secondary | ICD-10-CM

## 2017-12-12 DIAGNOSIS — B3731 Acute candidiasis of vulva and vagina: Secondary | ICD-10-CM

## 2017-12-12 DIAGNOSIS — R0781 Pleurodynia: Secondary | ICD-10-CM | POA: Diagnosis not present

## 2017-12-12 LAB — POCT URINALYSIS DIPSTICK
Bilirubin, UA: NEGATIVE
Blood, UA: NEGATIVE
Glucose, UA: NEGATIVE
KETONES UA: NEGATIVE
Leukocytes, UA: NEGATIVE
NITRITE UA: NEGATIVE
PH UA: 5.5 (ref 5.0–8.0)
PROTEIN UA: POSITIVE — AB
UROBILINOGEN UA: 0.2 U/dL

## 2017-12-12 LAB — POCT GLYCOSYLATED HEMOGLOBIN (HGB A1C): HEMOGLOBIN A1C: 7.7 % — AB (ref 4.0–5.6)

## 2017-12-12 MED ORDER — CEPHALEXIN 500 MG PO CAPS: 500.0000 mg | ORAL_CAPSULE | Freq: Two times a day (BID) | ORAL | 0 refills | Status: DC

## 2017-12-12 MED ORDER — FLUCONAZOLE 150 MG PO TABS: 150.0000 mg | ORAL_TABLET | Freq: Once | ORAL | 1 refills | Status: AC

## 2017-12-12 MED ORDER — DULAGLUTIDE 0.75 MG/0.5ML ~~LOC~~ SOAJ: 0.5000 mL | SUBCUTANEOUS | 11 refills | Status: AC

## 2017-12-12 MED ORDER — GABAPENTIN 600 MG PO TABS: 1200.0000 mg | ORAL_TABLET | Freq: Three times a day (TID) | ORAL | 1 refills | Status: DC

## 2017-12-12 NOTE — Progress Notes (Signed)
HPI: Angel [name] is a 57 y.o. female  who presents to [place] today, 12/12/17,  for chief complaint of:   DM2  HTN  Concern for UTI  Rib pain  Neck Pain   DIABETES SCREENING/PREVENTIVE CARE: Current meds/sugars today, 12/12/17:  Basaglar: 15 units daily, Metformin intolerant   Home Glc: Patient is not checking home blood sugar A1C and weight  12/13/16: 7.6% on Lantus 15 daily, saxagliptin 5 daily  03/15/17: 8.2% Wt 133 lb. She has been cheating on diet a little bit, no meds adjustments made, diet counselled  06/15/17: 6.5% Wt 126 lb. Cost of saxagliptin has gone up and she would like to discuss alternative treatments. We stopped the Saxagliptin since A1C and weight were good.   09/13/17: Wt 132. A1C was 7.4% diet control and weight loss discussed otherwise if A1C no better would discuss increase insulin at next visit.   Today, 12/12/17: Wt 135 lb. A1C 7.7% reports that she has not been as careful that she could be about diet, reports occasional hypoglycemic symptoms if she does not eat very much for breakfast. BP goal <130/80: Patient reports blood pressure is elevated today due to possible UTI LDL goal <70: close! 74 in 08/2016 Eye exam annually: none on file, importance discussed with patient , she is following w/ ophtho for glaucoma Foot exam: will get next visit  Microalbuminuria: n/a Metformin: No - intolerant ACE/ARB: Yes  Antiplatelet if ASCVD Risk >10%: Yes  Statin: declined  Pneumovax: Yes 09/04/16  New: concern for UTI  Dysuria for about 3 days, no lower back pain, abdominal pain, fever.  Some frequency but does not seem to be much different from her usual.  No incontinence  Neck pain: Asks if might be possible to go up a little bit on the gabapentin dose  Rib pain:  Reports ongoing rib pain on the right side for the past month or so with a deep breath.  No cough, no chest pain or pressure on exertion, no orthopnea,  no injury    Past medical history, surgical history, social history and family history reviewed.  Patient Active Problem List   Diagnosis Date Noted  . Lymphocytosis 11/08/2016  . Numbness and tingling in both hands 09/04/2016  . Primary osteoarthritis, left shoulder 05/23/2016  . Cervical spinal stenosis 12/13/2015  . Acne vulgaris 08/03/2014  . Diabetes mellitus type 2 in nonobese ([place]) 03/11/2013  . Essential hypertension 03/11/2013  . Glaucoma (increased eye pressure) 03/11/2013  . Menopausal and postmenopausal disorder 03/11/2013  . IDDM (insulin dependent diabetes mellitus) ([place]) 02/23/2012  . NS (nuclear sclerosis) 02/23/2012  . Primary open angle glaucoma 02/23/2012    Current medication list and allergy/intolerance information reviewed.   Current Outpatient Medications on File Prior to Visit  Medication Sig Dispense Refill  . aspirin 81 MG chewable tablet Chew 81 mg by mouth.    . Blood Glucose Monitoring Suppl ([place]) w/Device KIT     . Cholecalciferol (VITAMIN D3) 2000 units TABS Take by mouth.    . DULoxetine (CYMBALTA) 30 MG capsule Take 1 capsule (30 mg total) by mouth daily. For use with 60 mg capsules 90 capsule 3  . DULoxetine (CYMBALTA) 60 MG capsule Take 1 capsule (60 mg total) by mouth daily. For use with 30 mg capsules 90 capsule 3  . gabapentin (NEURONTIN) 600 MG tablet TAKE 2 TABLETS BY MOUTH TWICE DAILY 120 tablet 1  . Insulin Glargine (BASAGLAR KWIKPEN) 100 UNIT/ML SOPN INJECT  15 UNITS SUBCUTANEOUSLY  ONCE DAILY 15 pen 5  . latanoprost (XALATAN) 0.005 % ophthalmic solution     . losartan-hydrochlorothiazide (HYZAAR) 100-12.5 MG tablet TAKE 1 TABLET BY MOUTH ONCE DAILY 90 tablet 0  . timolol (TIMOPTIC) 0.5 % ophthalmic solution 0.5 mLs.    Marland Kitchen tiZANidine (ZANAFLEX) 4 MG tablet Take 1-2 tablets (4-8 mg total) by mouth at bedtime. 60 tablet 3  . traMADol (ULTRAM) 50 MG tablet TAKE 1 TABLET BY MOUTH TWICE DAILY 60 tablet 1   No current  facility-administered medications on file prior to visit.    No Known Allergies    Review of Systems:  Constitutional: No recent illness  Cardiac: No  chest pain, No  pressure  Respiratory:  No  shortness of breath. No  Cough  Gastrointestinal: No  abdominal pain  GU: Concern for UTI as per HPI  Musculoskeletal: +myalgia/arthralgia, chornic neck pain   Skin: No  Rash  Exam:  BP (!) 152/91 (BP Location: Left Arm, Patient Position: Sitting, Cuff Size: Normal)   Pulse 60   Temp 98.2 F (36.8 C) (Oral)   Wt 135 lb 4.8 oz (61.4 kg)   BMI 22.52 kg/m   Constitutional: VS see above. General Appearance: alert, well-developed, well-nourished, NAD  Eyes: Normal lids and conjunctive, non-icteric sclera  Ears, Nose, Mouth, Throat: MMM, Normal external inspection ears/nares/mouth/lips/gums.  Neck: No masses, trachea midline.   Respiratory: Normal respiratory effort. no wheeze, no rhonchi, no rales  Cardiovascular: S1/S2 normal, no murmur, no rub/gallop auscultated. RRR.   Musculoskeletal: Gait normal. Symmetric and independent movement of all extremities, Lloyd's sign negative bilaterally  Neurological: Normal balance/coordination. No tremor.  Skin: warm, dry, intact.   Psychiatric: Normal judgment/insight. Normal mood and affect. Oriented x3.   Results for orders placed or performed in visit on 12/12/17 (from the past 24 hour(s))  POCT HgB A1C     Status: Abnormal   Collection Time: 12/12/17  9:31 AM  Result Value Ref Range   Hemoglobin A1C 7.7 (A) 4.0 - 5.6 %   HbA1c POC (<> result, manual entry)  4.0 - 5.6 %   HbA1c, POC (prediabetic range)  5.7 - 6.4 %   HbA1c, POC (controlled diabetic range)  0.0 - 7.0 %  POCT Urinalysis Dipstick     Status: Abnormal   Collection Time: 12/12/17  9:32 AM  Result Value Ref Range   Color, UA yellow    Clarity, UA clear    Glucose, UA Negative Negative   Bilirubin, UA Negative    Ketones, UA Negative    Spec Grav, UA >=1.030 (A)  1.010 - 1.025   Blood, UA Negative    pH, UA 5.5 5.0 - 8.0   Protein, UA Positive (A) Negative   Urobilinogen, UA 0.2 0.2 or 1.0 E.U./dL   Nitrite, UA Negative    Leukocytes, UA Negative Negative   Appearance     Odor         ASSESSMENT/PLAN:   Uncontrolled type 2 diabetes mellitus with hypoglycemia without coma ([place]) - Hesitate to increase insulin due to hypoglycemic episodes, patient needs to check home sugars, add Trulicity to regimen and see if this helps - Plan: POCT HgB A1C, Dulaglutide (TRULICITY) 6.96 EX/5.2WU SOPN  Dysuria - UA looks okay but symptoms of dysuria/UTI, will obtain culture, treat empirically for now - Plan: POCT Urinalysis Dipstick, Urine Culture, Urinalysis, microscopic only  Cystitis - Plan: cephALEXin (KEFLEX) 500 MG capsule  Essential hypertension - Above goal, would consider blood pressure  medication change.  Patient resistant to other p.o. antidiabetic meds which may help  Pleuritic chest pain - No red flags to concern for cardiac issue, await x-ray - Plan: DG Ribs Unilateral W/Chest Right  Vaginal yeast infection - Plan: fluconazole (DIFLUCAN) 150 MG tablet  Cervical spinal stenosis - Plan: gabapentin (NEURONTIN) 600 MG tablet  Primary osteoarthritis, left shoulder - Plan: gabapentin (NEURONTIN) 600 MG tablet       Follow-up plan: Return in about 3 months (around 03/14/2018) for recheck A1C, see me or Dr. Darene Lamer. sooner if needed.  Visit summary with medication list and pertinent instructions was printed for patient to review, alert [place] if any changes needed. All questions at time of visit were answered - patient instructed to contact office with any additional concerns. ER/RTC precautions were reviewed with the patient and understanding verbalized.

## 2017-12-12 NOTE — Patient Instructions (Signed)
Plan:  Adding once weekly injection for diabetes - see med list. Will recheck A1C 3 mos, work on diet/exercise! Check sugars fasting AM daily!  Medicine for yeast infection and UTI - will call if need to change antibiotics based on urine culture, can expect results in 2-3 days  Xray today for chest discomfort, would follow up with Dr T if this isn't better   Increase Gabapentin as directed - see med list

## 2017-12-13 ENCOUNTER — Ambulatory Visit: Payer: BLUE CROSS/BLUE SHIELD | Admitting: Osteopathic Medicine

## 2017-12-14 LAB — URINE CULTURE
MICRO NUMBER:: 90852592
Result:: NO GROWTH
SPECIMEN QUALITY:: ADEQUATE

## 2017-12-14 LAB — URINALYSIS, MICROSCOPIC ONLY
HYALINE CAST: NONE SEEN /LPF
RBC / HPF: NONE SEEN /HPF (ref 0–2)
Squamous Epithelial / LPF: NONE SEEN /HPF (ref <=5)
WBC, UA: NONE SEEN /HPF (ref 0–5)

## 2018-01-14 ENCOUNTER — Other Ambulatory Visit: Payer: Self-pay | Admitting: Sports Medicine

## 2018-01-22 ENCOUNTER — Inpatient Hospital Stay: Payer: BLUE CROSS/BLUE SHIELD | Attending: Hematology & Oncology | Admitting: Hematology & Oncology

## 2018-01-22 ENCOUNTER — Inpatient Hospital Stay: Payer: BLUE CROSS/BLUE SHIELD

## 2018-01-22 ENCOUNTER — Other Ambulatory Visit: Payer: Self-pay

## 2018-01-22 VITALS — BP 141/80 | HR 65 | Temp 98.2°F | Resp 20 | Wt 131.8 lb

## 2018-01-22 DIAGNOSIS — D72820 Lymphocytosis (symptomatic): Secondary | ICD-10-CM

## 2018-01-22 DIAGNOSIS — Z79899 Other long term (current) drug therapy: Secondary | ICD-10-CM | POA: Diagnosis not present

## 2018-01-22 LAB — CMP (CANCER CENTER ONLY)
ALT: 32 U/L (ref 0–44)
AST: 38 U/L (ref 15–41)
Albumin: 4.2 g/dL (ref 3.5–5.0)
Alkaline Phosphatase: 104 U/L (ref 38–126)
Anion gap: 6 (ref 5–15)
BUN: 13 mg/dL (ref 6–20)
CO2: 28 mmol/L (ref 22–32)
CREATININE: 1.14 mg/dL — AB (ref 0.44–1.00)
Calcium: 9.8 mg/dL (ref 8.9–10.3)
Chloride: 106 mmol/L (ref 98–111)
GFR, Est AFR Am: >60 mL/min (ref >60)
GFR, Estimated: 52 mL/min — ABNORMAL LOW (ref >60)
Glucose, Bld: 140 mg/dL — ABNORMAL HIGH (ref 70–99)
Potassium: 5.7 mmol/L — ABNORMAL HIGH (ref 3.5–5.1)
SODIUM: 140 mmol/L (ref 135–145)
Total Bilirubin: 0.3 mg/dL (ref 0.3–1.2)
Total Protein: 6.7 g/dL (ref 6.5–8.1)

## 2018-01-22 LAB — CBC WITH DIFFERENTIAL (CANCER CENTER ONLY)
BASOS ABS: 0 10*3/uL (ref 0.0–0.1)
Basophils Relative: 0 %
EOS ABS: 0.1 10*3/uL (ref 0.0–0.5)
EOS PCT: 1 %
HCT: 35.1 % (ref 34.8–46.6)
HEMOGLOBIN: 11.3 g/dL — AB (ref 11.6–15.9)
LYMPHS PCT: 71 %
Lymphs Abs: 7.7 10*3/uL — ABNORMAL HIGH (ref 0.9–3.3)
MCH: 30.2 pg (ref 26.0–34.0)
MCHC: 32.2 g/dL (ref 32.0–36.0)
MCV: 93.9 fL (ref 81.0–101.0)
Monocytes Absolute: 0.5 10*3/uL (ref 0.1–0.9)
Monocytes Relative: 5 %
NEUTROS PCT: 23 %
Neutro Abs: 2.4 10*3/uL (ref 1.5–6.5)
PLATELETS: 206 10*3/uL (ref 145–400)
RBC: 3.74 MIL/uL (ref 3.70–5.32)
RDW: 12.5 % (ref 11.1–15.7)
WBC: 10.8 10*3/uL — AB (ref 3.9–10.0)

## 2018-01-22 NOTE — Progress Notes (Signed)
Hematology and Oncology Follow Up Visit  Burkley Dech [id number] 07-Dec-1960 57 y.o. 01/22/2018   Principle Diagnosis:  Lymphoproliferative process - flow cytometry normal  Current Therapy:   Observation   Interim History:  Ms. Liberman is here today for follow-up.  We see her every 6 months.  Since we last saw her, she is been doing quite well.  She is had no issues.  She is had no fever.  She is had no swollen lymph nodes.  She has had no weight loss or weight gain.  Her appetite is doing well.  She is had no nausea or vomiting.  She had a mammogram a couple months ago.  She is had no change in bowel or bladder habits.  There is been no leg swelling.  She had no rashes.  Overall, her performance status is ECOG 0.   Medications:  Allergies as of 01/22/2018   No Known Allergies     Medication List        Accurate as of 01/22/18 10:51 AM. Always use your most recent med list.          BASAGLAR KWIKPEN 100 UNIT/ML Sopn INJECT 15 UNITS SUBCUTANEOUSLY  ONCE DAILY   CLEVER CHEK SYSTEM w/Device Kit   Dulaglutide 0.75 MG/0.5ML Sopn Inject 0.5 mLs into the skin once a week.   DULoxetine 30 MG capsule Commonly known as:  CYMBALTA Take 1 capsule (30 mg total) by mouth daily. For use with 60 mg capsules   DULoxetine 60 MG capsule Commonly known as:  CYMBALTA Take 1 capsule (60 mg total) by mouth daily. For use with 30 mg capsules   gabapentin 600 MG tablet Commonly known as:  NEURONTIN Take 2 tablets (1,200 mg total) by mouth 3 (three) times daily.   latanoprost 0.005 % ophthalmic solution Commonly known as:  XALATAN   losartan-hydrochlorothiazide 100-12.5 MG tablet Commonly known as:  HYZAAR TAKE 1 TABLET BY MOUTH ONCE DAILY   timolol 0.5 % ophthalmic solution Commonly known as:  TIMOPTIC 0.5 mLs.   tiZANidine 4 MG tablet Commonly known as:  ZANAFLEX TAKE 1 TO 2 TABLETS BY MOUTH AT BEDTIME   traMADol 50 MG tablet Commonly known as:  ULTRAM TAKE 1 TABLET BY  MOUTH TWICE DAILY       Allergies: No Known Allergies  Past Medical History, Surgical history, Social history, and Family History were reviewed and updated.  Review of Systems: All other 10 point review of systems is negative.   Physical Exam:  weight is 131 lb 12 oz (59.8 kg). Her oral temperature is 98.2 F (36.8 C). Her blood pressure is 141/80 (abnormal) and her pulse is 65. Her respiration is 20 and oxygen saturation is 99%.   Wt Readings from Last 3 Encounters:  01/22/18 131 lb 12 oz (59.8 kg)  12/12/17 135 lb 4.8 oz (61.4 kg)  09/13/17 132 lb 8 oz (60.1 kg)    Ocular: Sclerae unicteric, pupils equal, round and reactive to light Ear-nose-throat: Oropharynx clear, dentition fair Lymphatic: No cervical, supraclavicular or axillary adenopathy Lungs no rales or rhonchi, good excursion bilaterally Heart regular rate and rhythm, no murmur appreciated Abd soft, nontender, positive bowel sounds, no liver or spleen tip palpated on exam, no fluid wave  MSK no focal spinal tenderness, no joint edema Neuro: non-focal, well-oriented, appropriate affect Breasts: Deferred   Lab Results  Component Value Date   WBC 10.8 (H) 01/22/2018   HGB 11.3 (L) 01/22/2018   HCT 35.1 01/22/2018   MCV 93.9  01/22/2018   PLT 206 01/22/2018   Lab Results  Component Value Date   FERRITIN 70 03/23/2017   IRON 100 03/23/2017   TIBC 309 03/23/2017   UIBC 208 03/23/2017   IRONPCTSAT 32 03/23/2017   Lab Results  Component Value Date   RBC 3.74 01/22/2018   No results found for: KPAFRELGTCHN, LAMBDASER, KAPLAMBRATIO No results found for: Kandis Cocking, IGMSERUM No results found for: Odetta Pink, SPEI   Chemistry      Component Value Date/Time   NA 140 07/24/2017 1022   NA 141 03/23/2017 0838   K 4.3 07/24/2017 1022   K 4.3 03/23/2017 0838   CL 105 07/24/2017 1022   CO2 25 07/24/2017 1022   CO2 25 03/23/2017 0838   BUN 9 07/24/2017  1022   BUN 11.3 03/23/2017 0838   CREATININE 0.98 07/24/2017 1022   CREATININE 1.0 03/23/2017 0838      Component Value Date/Time   CALCIUM 9.2 07/24/2017 1022   CALCIUM 9.3 03/23/2017 0838   ALKPHOS 102 07/24/2017 1022   ALKPHOS 98 03/23/2017 0838   AST 36 (H) 07/24/2017 1022   AST 33 03/23/2017 0838   ALT 33 07/24/2017 1022   ALT 28 03/23/2017 0838   BILITOT 0.3 07/24/2017 1022   BILITOT 0.39 03/23/2017 0838      Impression and Plan: [name] is a very pleasant 57 yo African American female with an elevated lymphocyte count and normal flow cytometry.   From my point of view, I do still think that we are going to make any impact on her health care.  We really cannot find any malignancy.  Her flow cytometry being normal really rules out any type of lymphoproliferative process.  I told her that we would be more than happy to see her back if there is a problem down the road.  Again, I just do not think that we are going to have an issue.  As always, we had a very good fellowship time.   Volanda Napoleon, MD 8/27/201910:51 AM

## 2018-01-23 ENCOUNTER — Other Ambulatory Visit: Payer: Self-pay | Admitting: Osteopathic Medicine

## 2018-02-03 ENCOUNTER — Other Ambulatory Visit: Payer: Self-pay | Admitting: Sports Medicine

## 2018-02-04 ENCOUNTER — Other Ambulatory Visit: Payer: Self-pay | Admitting: Osteopathic Medicine

## 2018-03-14 ENCOUNTER — Ambulatory Visit: Payer: BLUE CROSS/BLUE SHIELD | Admitting: Osteopathic Medicine

## 2018-03-14 ENCOUNTER — Encounter: Payer: Self-pay | Admitting: Osteopathic Medicine

## 2018-03-14 VITALS — BP 137/74 | HR 60 | Temp 98.2°F | Wt 132.2 lb

## 2018-03-14 DIAGNOSIS — Z23 Encounter for immunization: Secondary | ICD-10-CM

## 2018-03-14 DIAGNOSIS — D72829 Elevated white blood cell count, unspecified: Secondary | ICD-10-CM | POA: Diagnosis not present

## 2018-03-14 DIAGNOSIS — E119 Type 2 diabetes mellitus without complications: Secondary | ICD-10-CM

## 2018-03-14 DIAGNOSIS — I10 Essential (primary) hypertension: Secondary | ICD-10-CM

## 2018-03-14 LAB — POCT GLYCOSYLATED HEMOGLOBIN (HGB A1C): Hemoglobin A1C: 6.6 % — AB (ref 4.0–5.6)

## 2018-03-14 NOTE — Progress Notes (Signed)
HPI: Angel [name] is a 57 y.o. female  who presents to [place] today, 03/14/18,  for chief complaint of:   DM2  HTN   Doing well today, no complaints other than diabetes follow-up.   DIABETES SCREENING/PREVENTIVE CARE: Current meds/sugars today, 03/14/18:  Basaglar: 15 units daily  Metformin intolerant   Hypoglycemic concern at last visit, we added Trulicity   Home Glc: Patient is not checking home blood sugar A1C and weight  03/15/17: 8.2% Wt 133 lb. She has been cheating on diet a little bit, no meds adjustments made, diet counselled  06/15/17: 6.5% Wt 126 lb. Cost of saxagliptin has gone up and she would like to discuss alternative treatments. We stopped the Saxagliptin since A1C and weight were good.   09/13/17: Wt 132. A1C was 7.4% diet control and weight loss discussed otherwise if A1C no better would discuss increase insulin at next visit.   12/12/17: Wt 135 lb. A1C 7.7% reports that she has not been as careful that she could be about diet, reports occasional hypoglycemic symptoms if she does not eat very much for breakfast.  Today, 03/14/18: Wt 132 lb. A1C 6.6% hypoglycemia only if she otherwise, no problems, tolerating the Trulicity well. BP goal <962/22: systolic 979, diastolic ok LDL goal <89: close! 74 in 08/2016 Eye exam annually: none on file, importance discussed with patient , she is following w/ ophtho for glaucoma Foot exam: will get next visit  Microalbuminuria: n/a Metformin: No - intolerant ACE/ARB: Yes  Antiplatelet if ASCVD Risk >10%: Yes  Statin: declined  Pneumovax: Yes 09/04/16  Wt Readings from Last 3 Encounters:  03/14/18 132 lb 3.2 oz (60 kg)  01/22/18 131 lb 12 oz (59.8 kg)  12/12/17 135 lb 4.8 oz (61.4 kg)    BP Readings from Last 3 Encounters:  03/14/18 137/74  01/22/18 (!) 141/80  12/12/17 (!) 152/91    No results found for this or any previous visit (from the past 24  hour(s)).     Past medical history, surgical history, social history and family history reviewed.  Patient Active Problem List   Diagnosis Date Noted  . Lymphocytosis 11/08/2016  . Numbness and tingling in both hands 09/04/2016  . Primary osteoarthritis, left shoulder 05/23/2016  . Cervical spinal stenosis 12/13/2015  . Acne vulgaris 08/03/2014  . Diabetes mellitus type 2 in nonobese ([place]) 03/11/2013  . Essential hypertension 03/11/2013  . Glaucoma (increased eye pressure) 03/11/2013  . Menopausal and postmenopausal disorder 03/11/2013  . IDDM (insulin dependent diabetes mellitus) ([place]) 02/23/2012  . NS (nuclear sclerosis) 02/23/2012  . Primary open angle glaucoma 02/23/2012    Current medication list and allergy/intolerance information reviewed.   Current Outpatient Medications on File Prior to Visit  Medication Sig Dispense Refill  . Blood Glucose Monitoring Suppl ([place]) w/Device KIT     . Dulaglutide (TRULICITY) 2.11 HE/1.7EY SOPN Inject 0.5 mLs into the skin once a week. 4 pen 11  . DULoxetine (CYMBALTA) 30 MG capsule Take 1 capsule (30 mg total) by mouth daily. For use with 60 mg capsules 90 capsule 3  . DULoxetine (CYMBALTA) 60 MG capsule Take 1 capsule (60 mg total) by mouth daily. For use with 30 mg capsules 90 capsule 3  . gabapentin (NEURONTIN) 600 MG tablet Take 2 tablets (1,200 mg total) by mouth 3 (three) times daily. 540 tablet 1  . Insulin Glargine (BASAGLAR KWIKPEN) 100 UNIT/ML SOPN INJECT 15 UNITS SUBCUTANEOUSLY  ONCE DAILY 15 pen 5  .  latanoprost (XALATAN) 0.005 % ophthalmic solution     . losartan-hydrochlorothiazide (HYZAAR) 100-12.5 MG tablet TAKE 1 TABLET BY MOUTH ONCE DAILY 90 tablet 0  . timolol (TIMOPTIC) 0.5 % ophthalmic solution 0.5 mLs.    Marland Kitchen tiZANidine (ZANAFLEX) 4 MG tablet TAKE 1 TO 2 TABLETS BY MOUTH AT BEDTIME 60 tablet 3  . traMADol (ULTRAM) 50 MG tablet TAKE 1 TABLET BY MOUTH TWICE DAILY 60 tablet 1   No current  facility-administered medications on file prior to visit.    No Known Allergies    Review of Systems:  Constitutional: No recent illness  Cardiac: No  chest pain, No  pressure  Respiratory:  No  shortness of breath. No  Cough  Gastrointestinal: No  abdominal pain  Musculoskeletal: +myalgia/arthralgia, chornic neck pain -stable  Skin: No  Rash  Exam:  BP 137/74 (BP Location: Left Arm, Patient Position: Sitting, Cuff Size: Normal)   Pulse 60   Temp 98.2 F (36.8 C) (Oral)   Wt 132 lb 3.2 oz (60 kg)   BMI 22.00 kg/m   Constitutional: VS see above. General Appearance: alert, well-developed, well-nourished, NAD  Eyes: Normal lids and conjunctive, non-icteric sclera  Ears, Nose, Mouth, Throat: MMM, Normal external inspection ears/nares/mouth/lips/gums.  Neck: No masses, trachea midline.   Respiratory: Normal respiratory effort. no wheeze, no rhonchi, no rales  Cardiovascular: S1/S2 normal, no murmur, no rub/gallop auscultated. RRR.   Musculoskeletal: Gait normal. Symmetric and independent movement of all extremities, Lloyd's sign negative bilaterally  Neurological: Normal balance/coordination. No tremor.  Skin: warm, dry, intact.   Psychiatric: Normal judgment/insight. Normal mood and affect. Oriented x3.       ASSESSMENT/PLAN: The primary encounter diagnosis was Diabetes mellitus type 2 in nonobese [facility]). Diagnoses of Need for influenza vaccination and Essential hypertension were also pertinent to this visit.  Orders Placed This Encounter  Procedures  . Flu Vaccine QUAD 6+ mos PF IM (Fluarix Quad PF)  . CBC  . COMPLETE METABOLIC PANEL WITH GFR  . Lipid panel  . TSH  . VITAMIN D 25 Hydroxy (Vit-D Deficiency, Fractures)    .      Follow-up plan: Return in about 3 months (around 06/14/2018) for diabetes follow-up A1C, sooner if needed w/ Dr A or Dr T.  Visit summary with medication list and pertinent instructions was printed for patient to review, alert [place]  if any changes needed. All questions at time of visit were answered - patient instructed to contact office with any additional concerns. ER/RTC precautions were reviewed with the patient and understanding verbalized.

## 2018-03-14 NOTE — Addendum Note (Signed)
Addended by: Mertha Finders on: 03/14/2018 10:00 AM   Modules accepted: Orders

## 2018-03-15 LAB — COMPLETE METABOLIC PANEL WITH GFR
AG Ratio: 2.4 (calc) (ref 1.0–2.5)
ALT: 28 U/L (ref 6–29)
AST: 32 U/L (ref 10–35)
Albumin: 4.7 g/dL (ref 3.6–5.1)
Alkaline phosphatase (APISO): 88 U/L (ref 33–130)
BUN: 13 mg/dL (ref 7–25)
CO2: 29 mmol/L (ref 20–32)
CREATININE: 0.85 mg/dL (ref 0.50–1.05)
Calcium: 9.9 mg/dL (ref 8.6–10.4)
Chloride: 100 mmol/L (ref 98–110)
GFR, EST NON AFRICAN AMERICAN: 76 mL/min/{1.73_m2} (ref >=60)
GFR, Est African American: 88 mL/min/{1.73_m2} (ref >=60)
GLOBULIN: 2 g/dL (ref 1.9–3.7)
GLUCOSE: 53 mg/dL — AB (ref 65–139)
Potassium: 5.4 mmol/L — ABNORMAL HIGH (ref 3.5–5.3)
Sodium: 138 mmol/L (ref 135–146)
Total Bilirubin: 0.3 mg/dL (ref 0.2–1.2)
Total Protein: 6.7 g/dL (ref 6.1–8.1)

## 2018-03-15 LAB — TEST AUTHORIZATION

## 2018-03-15 LAB — VITAMIN D 25 HYDROXY (VIT D DEFICIENCY, FRACTURES): VIT D 25 HYDROXY: 44 ng/mL (ref 30–100)

## 2018-03-15 LAB — CBC
HEMATOCRIT: 35.2 % (ref 35.0–45.0)
Hemoglobin: 11.7 g/dL (ref 11.7–15.5)
MCH: 30.3 pg (ref 27.0–33.0)
MCHC: 33.2 g/dL (ref 32.0–36.0)
MCV: 91.2 fL (ref 80.0–100.0)
MPV: 10.1 fL (ref 7.5–12.5)
Platelets: 278 10*3/uL (ref 140–400)
RBC: 3.86 10*6/uL (ref 3.80–5.10)
RDW: 13.4 % (ref 11.0–15.0)
WBC: 15.4 10*3/uL — AB (ref 3.8–10.8)

## 2018-03-15 LAB — LIPID PANEL
Cholesterol: 180 mg/dL (ref <200)
HDL: 61 mg/dL (ref >50)
LDL CHOLESTEROL (CALC): 103 mg/dL — AB
NON-HDL CHOLESTEROL (CALC): 119 mg/dL (ref <130)
TRIGLYCERIDES: 70 mg/dL (ref <150)
Total CHOL/HDL Ratio: 3 (calc) (ref <5.0)

## 2018-03-15 LAB — CBC WITH DIFFERENTIAL/PLATELET
BASOS ABS: 62 {cells}/uL (ref 0–200)
Basophils Relative: 0.4 %
EOS PCT: 0.7 %
Eosinophils Absolute: 108 cells/uL (ref 15–500)
HCT: 35.2 % (ref 35.0–45.0)
Hemoglobin: 11.7 g/dL (ref 11.7–15.5)
Lymphs Abs: 12043 cells/uL — ABNORMAL HIGH (ref 850–3900)
MCH: 30.3 pg (ref 27.0–33.0)
MCHC: 33.2 g/dL (ref 32.0–36.0)
MCV: 91.2 fL (ref 80.0–100.0)
MONOS PCT: 3.4 %
MPV: 10.1 fL (ref 7.5–12.5)
NEUTROS ABS: 2664 {cells}/uL (ref 1500–7800)
NEUTROS PCT: 17.3 %
PLATELETS: 278 10*3/uL (ref 140–400)
RBC: 3.86 10*6/uL (ref 3.80–5.10)
RDW: 13.4 % (ref 11.0–15.0)
Total Lymphocyte: 78.2 %
WBC mixed population: 524 cells/uL (ref 200–950)
WBC: 15.4 10*3/uL — ABNORMAL HIGH (ref 3.8–10.8)

## 2018-03-15 LAB — TSH: TSH: 0.6 mIU/L (ref 0.40–4.50)

## 2018-03-18 NOTE — Addendum Note (Signed)
Addended by: Maryla Morrow on: 03/18/2018 01:11 PM   Modules accepted: Orders

## 2018-03-19 ENCOUNTER — Telehealth: Payer: Self-pay

## 2018-03-19 DIAGNOSIS — D72829 Elevated white blood cell count, unspecified: Secondary | ICD-10-CM | POA: Diagnosis not present

## 2018-03-19 NOTE — Telephone Encounter (Signed)
[id number] ????

## 2018-03-19 NOTE — Telephone Encounter (Signed)
Okay, that's strange, I thought that this was something that they did at whatever the central lab is.  I certainly do not expect them to do this downstairs as there is not a Industrial/product designer on staff.  This is the same order I've been putting in forever. Can we double check on this to see if the order has changed? Path smear is standard workup for abnormal CBC. May also be called peripheral smear?

## 2018-03-19 NOTE — Telephone Encounter (Signed)
I typed that in it ([id number]) comes up for an order for a prescription for riboflavin, nothing comes up with [id number].,  My patient needs this test done, what do we need to do to make it happen? I would not be upset if they just changed this order to whatever they've been doing.Marland KitchenMarland Kitchen

## 2018-03-19 NOTE — Telephone Encounter (Signed)
Angel E. From [facility] lab downstairs called to let provider know that the test "Pathologist smear review" was not able to be done downstairs since they do not do slides. Angel states she was able to change test to "CBC with pathologist smear" and that is one that can be drawn downstairs and have smears preformed, yielding same results.   Note to Provider

## 2018-03-19 NOTE — Telephone Encounter (Signed)
Spoke to Angel [name], Path review order number is [id number]. States this is not new, this has been the case. Evette states that she is the one in charge of all employees downstairs and she apologizes that no one has brought this to your attention before. She states that it appears in the past this has just been changed without advising out office, and she does apologize for that.  She is open and available if any other questions, needs, or concerns.

## 2018-03-20 LAB — CBC (INCLUDES DIFF/PLT) WITH PATHOLOGIST REVIEW
BASOS ABS: 53 {cells}/uL (ref 0–200)
Basophils Relative: 0.7 %
EOS PCT: 0.9 %
Eosinophils Absolute: 68 cells/uL (ref 15–500)
HCT: 32.9 % — ABNORMAL LOW (ref 35.0–45.0)
Hemoglobin: 11.2 g/dL — ABNORMAL LOW (ref 11.7–15.5)
Lymphs Abs: 5343 cells/uL — ABNORMAL HIGH (ref 850–3900)
MCH: 30.5 pg (ref 27.0–33.0)
MCHC: 34 g/dL (ref 32.0–36.0)
MCV: 89.6 fL (ref 80.0–100.0)
MONOS PCT: 6 %
MPV: 10.7 fL (ref 7.5–12.5)
NEUTROS ABS: 1680 {cells}/uL (ref 1500–7800)
NEUTROS PCT: 22.1 %
PLATELETS: 234 10*3/uL (ref 140–400)
RBC: 3.67 10*6/uL — ABNORMAL LOW (ref 3.80–5.10)
RDW: 13.1 % (ref 11.0–15.0)
Total Lymphocyte: 70.3 %
WBC mixed population: 456 cells/uL (ref 200–950)
WBC: 7.6 10*3/uL (ref 3.8–10.8)

## 2018-03-20 NOTE — Telephone Encounter (Signed)
[id number] "CBC (includes diff) with pathologist review" is the correct order  Previous order was already corrected/changed by lab

## 2018-03-21 NOTE — Telephone Encounter (Signed)
Noted, thanks!

## 2018-03-29 ENCOUNTER — Telehealth: Payer: Self-pay

## 2018-03-29 NOTE — Telephone Encounter (Signed)
Pt has been updated. No other inquiries during call.  

## 2018-03-29 NOTE — Telephone Encounter (Signed)
White blood cell count was looking better but some of the cells looked a bit abnormal under the microscope.  I will route results to Dr. Marin Olp, patient may need to follow-up with him

## 2018-03-29 NOTE — Telephone Encounter (Signed)
Pt called requesting results for pathologist smear review. Pls advise, thanks.

## 2018-04-23 ENCOUNTER — Other Ambulatory Visit: Payer: Self-pay | Admitting: Sports Medicine

## 2018-04-23 MED ORDER — TRAMADOL HCL 50 MG PO TABS: 50.0000 mg | ORAL_TABLET | Freq: Two times a day (BID) | ORAL | 3 refills | Status: AC

## 2018-04-29 ENCOUNTER — Other Ambulatory Visit: Payer: Self-pay | Admitting: Osteopathic Medicine

## 2018-05-26 ENCOUNTER — Other Ambulatory Visit: Payer: Self-pay | Admitting: Sports Medicine

## 2018-05-26 DIAGNOSIS — M4802 Spinal stenosis, cervical region: Secondary | ICD-10-CM

## 2018-06-03 DIAGNOSIS — I10 Essential (primary) hypertension: Secondary | ICD-10-CM | POA: Diagnosis not present

## 2018-06-03 DIAGNOSIS — E119 Type 2 diabetes mellitus without complications: Secondary | ICD-10-CM | POA: Diagnosis not present

## 2018-06-03 DIAGNOSIS — M542 Cervicalgia: Secondary | ICD-10-CM | POA: Diagnosis not present

## 2018-06-03 DIAGNOSIS — G8929 Other chronic pain: Secondary | ICD-10-CM | POA: Diagnosis not present

## 2018-06-12 ENCOUNTER — Ambulatory Visit: Payer: BLUE CROSS/BLUE SHIELD | Admitting: Osteopathic Medicine

## 2018-07-01 DIAGNOSIS — M542 Cervicalgia: Secondary | ICD-10-CM | POA: Diagnosis not present

## 2018-07-01 DIAGNOSIS — I10 Essential (primary) hypertension: Secondary | ICD-10-CM | POA: Diagnosis not present

## 2018-07-01 DIAGNOSIS — J00 Acute nasopharyngitis [common cold]: Secondary | ICD-10-CM | POA: Diagnosis not present

## 2018-07-01 DIAGNOSIS — G8929 Other chronic pain: Secondary | ICD-10-CM | POA: Diagnosis not present

## 2018-07-07 ENCOUNTER — Other Ambulatory Visit: Payer: Self-pay | Admitting: Sports Medicine

## 2018-08-04 ENCOUNTER — Other Ambulatory Visit: Payer: Self-pay | Admitting: Osteopathic Medicine

## 2018-08-04 DIAGNOSIS — M4802 Spinal stenosis, cervical region: Secondary | ICD-10-CM

## 2018-08-04 DIAGNOSIS — M19012 Primary osteoarthritis, left shoulder: Secondary | ICD-10-CM

## 2018-08-16 ENCOUNTER — Ambulatory Visit: Payer: BLUE CROSS/BLUE SHIELD | Admitting: Sports Medicine

## 2018-08-29 DIAGNOSIS — W19XXXA Unspecified fall, initial encounter: Secondary | ICD-10-CM | POA: Diagnosis not present

## 2018-08-29 DIAGNOSIS — E119 Type 2 diabetes mellitus without complications: Secondary | ICD-10-CM | POA: Diagnosis not present

## 2018-08-29 DIAGNOSIS — I10 Essential (primary) hypertension: Secondary | ICD-10-CM | POA: Diagnosis not present

## 2018-08-29 DIAGNOSIS — M542 Cervicalgia: Secondary | ICD-10-CM | POA: Diagnosis not present

## 2018-10-15 ENCOUNTER — Telehealth: Payer: Self-pay | Admitting: Sports Medicine

## 2018-10-15 NOTE — Telephone Encounter (Signed)
You are right, lets do a Doximity visit.

## 2018-10-16 NOTE — Telephone Encounter (Signed)
Please contact Pt and schedule virtual visit. Thank you.

## 2018-10-16 NOTE — Telephone Encounter (Signed)
Patient states because of her insurance she had to change providers. She is no longer with [facility], now she is with [facility] and has a wake doctor. No further questions at this time and I have removed Dr.Alexander as PCP.

## 2018-11-04 DIAGNOSIS — S01511A Laceration without foreign body of lip, initial encounter: Secondary | ICD-10-CM | POA: Diagnosis not present

## 2018-11-04 DIAGNOSIS — S01551D Open bite of lip, subsequent encounter: Secondary | ICD-10-CM | POA: Diagnosis not present

## 2018-11-04 DIAGNOSIS — W540XXA Bitten by dog, initial encounter: Secondary | ICD-10-CM | POA: Diagnosis not present

## 2018-11-04 DIAGNOSIS — S00571A Other superficial bite of lip, initial encounter: Secondary | ICD-10-CM | POA: Diagnosis not present

## 2018-11-04 DIAGNOSIS — Y999 Unspecified external cause status: Secondary | ICD-10-CM | POA: Diagnosis not present

## 2018-11-04 DIAGNOSIS — Z23 Encounter for immunization: Secondary | ICD-10-CM | POA: Diagnosis not present

## 2018-11-04 DIAGNOSIS — W540XXD Bitten by dog, subsequent encounter: Secondary | ICD-10-CM | POA: Diagnosis not present

## 2018-11-05 ENCOUNTER — Other Ambulatory Visit: Payer: Self-pay | Admitting: Osteopathic Medicine

## 2018-11-05 DIAGNOSIS — S01551D Open bite of lip, subsequent encounter: Secondary | ICD-10-CM | POA: Diagnosis not present

## 2018-11-05 DIAGNOSIS — E11649 Type 2 diabetes mellitus with hypoglycemia without coma: Secondary | ICD-10-CM

## 2018-11-05 DIAGNOSIS — Z23 Encounter for immunization: Secondary | ICD-10-CM | POA: Diagnosis not present

## 2018-11-05 DIAGNOSIS — S00571A Other superficial bite of lip, initial encounter: Secondary | ICD-10-CM | POA: Diagnosis not present

## 2018-11-05 DIAGNOSIS — W540XXD Bitten by dog, subsequent encounter: Secondary | ICD-10-CM | POA: Diagnosis not present

## 2018-11-13 ENCOUNTER — Other Ambulatory Visit: Payer: Self-pay | Admitting: Osteopathic Medicine

## 2018-11-13 DIAGNOSIS — E11649 Type 2 diabetes mellitus with hypoglycemia without coma: Secondary | ICD-10-CM

## 2018-11-15 DIAGNOSIS — S01511D Laceration without foreign body of lip, subsequent encounter: Secondary | ICD-10-CM | POA: Diagnosis not present

## 2018-12-07 ENCOUNTER — Other Ambulatory Visit: Payer: Self-pay | Admitting: Osteopathic Medicine

## 2018-12-11 DIAGNOSIS — M542 Cervicalgia: Secondary | ICD-10-CM | POA: Diagnosis not present

## 2018-12-11 DIAGNOSIS — H401130 Primary open-angle glaucoma, bilateral, stage unspecified: Secondary | ICD-10-CM | POA: Diagnosis not present

## 2018-12-11 DIAGNOSIS — E119 Type 2 diabetes mellitus without complications: Secondary | ICD-10-CM | POA: Diagnosis not present

## 2018-12-11 DIAGNOSIS — I10 Essential (primary) hypertension: Secondary | ICD-10-CM | POA: Diagnosis not present

## 2019-01-07 ENCOUNTER — Other Ambulatory Visit: Payer: Self-pay | Admitting: Osteopathic Medicine

## 2019-02-01 ENCOUNTER — Other Ambulatory Visit: Payer: Self-pay | Admitting: Osteopathic Medicine

## 2019-02-01 DIAGNOSIS — M19012 Primary osteoarthritis, left shoulder: Secondary | ICD-10-CM

## 2019-02-01 DIAGNOSIS — M4802 Spinal stenosis, cervical region: Secondary | ICD-10-CM

## 2019-02-10 ENCOUNTER — Other Ambulatory Visit: Payer: Self-pay | Admitting: Osteopathic Medicine

## 2019-02-10 DIAGNOSIS — M19012 Primary osteoarthritis, left shoulder: Secondary | ICD-10-CM

## 2019-02-10 DIAGNOSIS — M4802 Spinal stenosis, cervical region: Secondary | ICD-10-CM

## 2019-02-10 NOTE — Telephone Encounter (Signed)
VM left for patient to contact office to schedule a follow up appointment

## 2019-03-18 DIAGNOSIS — I10 Essential (primary) hypertension: Secondary | ICD-10-CM | POA: Diagnosis not present

## 2019-03-18 DIAGNOSIS — E119 Type 2 diabetes mellitus without complications: Secondary | ICD-10-CM | POA: Diagnosis not present

## 2019-03-20 DIAGNOSIS — E119 Type 2 diabetes mellitus without complications: Secondary | ICD-10-CM | POA: Diagnosis not present

## 2019-03-20 DIAGNOSIS — M542 Cervicalgia: Secondary | ICD-10-CM | POA: Diagnosis not present

## 2019-03-20 DIAGNOSIS — Z1239 Encounter for other screening for malignant neoplasm of breast: Secondary | ICD-10-CM | POA: Diagnosis not present

## 2019-03-20 DIAGNOSIS — Z23 Encounter for immunization: Secondary | ICD-10-CM | POA: Diagnosis not present

## 2019-03-20 DIAGNOSIS — I10 Essential (primary) hypertension: Secondary | ICD-10-CM | POA: Diagnosis not present

## 2019-03-20 DIAGNOSIS — Z0000 Encounter for general adult medical examination without abnormal findings: Secondary | ICD-10-CM | POA: Diagnosis not present

## 2019-03-20 DIAGNOSIS — M4802 Spinal stenosis, cervical region: Secondary | ICD-10-CM | POA: Diagnosis not present

## 2019-03-27 DIAGNOSIS — H401132 Primary open-angle glaucoma, bilateral, moderate stage: Secondary | ICD-10-CM | POA: Diagnosis not present

## 2019-05-13 DIAGNOSIS — H401132 Primary open-angle glaucoma, bilateral, moderate stage: Secondary | ICD-10-CM | POA: Diagnosis not present

## 2019-07-01 DIAGNOSIS — E119 Type 2 diabetes mellitus without complications: Secondary | ICD-10-CM | POA: Diagnosis not present

## 2019-07-01 DIAGNOSIS — H401111 Primary open-angle glaucoma, right eye, mild stage: Secondary | ICD-10-CM | POA: Diagnosis not present

## 2019-07-01 DIAGNOSIS — I10 Essential (primary) hypertension: Secondary | ICD-10-CM | POA: Diagnosis not present

## 2019-07-01 DIAGNOSIS — Z9189 Other specified personal risk factors, not elsewhere classified: Secondary | ICD-10-CM | POA: Diagnosis not present

## 2019-08-11 DIAGNOSIS — H401132 Primary open-angle glaucoma, bilateral, moderate stage: Secondary | ICD-10-CM | POA: Diagnosis not present

## 2019-09-29 DIAGNOSIS — Z23 Encounter for immunization: Secondary | ICD-10-CM | POA: Diagnosis not present

## 2019-10-20 DIAGNOSIS — Z23 Encounter for immunization: Secondary | ICD-10-CM | POA: Diagnosis not present

## 2019-12-08 DIAGNOSIS — Z1231 Encounter for screening mammogram for malignant neoplasm of breast: Secondary | ICD-10-CM | POA: Diagnosis not present

## 2020-01-19 DIAGNOSIS — E119 Type 2 diabetes mellitus without complications: Secondary | ICD-10-CM | POA: Diagnosis not present

## 2020-01-19 DIAGNOSIS — I10 Essential (primary) hypertension: Secondary | ICD-10-CM | POA: Diagnosis not present

## 2020-01-19 DIAGNOSIS — M4802 Spinal stenosis, cervical region: Secondary | ICD-10-CM | POA: Diagnosis not present

## 2020-01-19 DIAGNOSIS — M542 Cervicalgia: Secondary | ICD-10-CM | POA: Diagnosis not present

## 2020-01-29 DIAGNOSIS — N764 Abscess of vulva: Secondary | ICD-10-CM | POA: Diagnosis not present

## 2020-02-18 DIAGNOSIS — R05 Cough: Secondary | ICD-10-CM | POA: Diagnosis not present

## 2020-02-18 DIAGNOSIS — Z1152 Encounter for screening for COVID-19: Secondary | ICD-10-CM | POA: Diagnosis not present

## 2020-02-18 DIAGNOSIS — Z20822 Contact with and (suspected) exposure to covid-19: Secondary | ICD-10-CM | POA: Diagnosis not present

## 2020-04-19 DIAGNOSIS — H401132 Primary open-angle glaucoma, bilateral, moderate stage: Secondary | ICD-10-CM | POA: Diagnosis not present

## 2020-04-26 DIAGNOSIS — I10 Essential (primary) hypertension: Secondary | ICD-10-CM | POA: Diagnosis not present

## 2020-04-26 DIAGNOSIS — E119 Type 2 diabetes mellitus without complications: Secondary | ICD-10-CM | POA: Diagnosis not present

## 2020-04-26 DIAGNOSIS — M4802 Spinal stenosis, cervical region: Secondary | ICD-10-CM | POA: Diagnosis not present

## 2020-04-26 DIAGNOSIS — M542 Cervicalgia: Secondary | ICD-10-CM | POA: Diagnosis not present

## 2020-05-18 DIAGNOSIS — S62662A Nondisplaced fracture of distal phalanx of right middle finger, initial encounter for closed fracture: Secondary | ICD-10-CM | POA: Diagnosis not present

## 2020-05-18 DIAGNOSIS — F17290 Nicotine dependence, other tobacco product, uncomplicated: Secondary | ICD-10-CM | POA: Diagnosis not present

## 2020-05-18 DIAGNOSIS — W230XXA Caught, crushed, jammed, or pinched between moving objects, initial encounter: Secondary | ICD-10-CM | POA: Diagnosis not present

## 2020-05-18 DIAGNOSIS — S61214A Laceration without foreign body of right ring finger without damage to nail, initial encounter: Secondary | ICD-10-CM | POA: Diagnosis not present

## 2020-05-18 DIAGNOSIS — S61212A Laceration without foreign body of right middle finger without damage to nail, initial encounter: Secondary | ICD-10-CM | POA: Diagnosis not present

## 2020-05-18 DIAGNOSIS — S62653A Nondisplaced fracture of medial phalanx of left middle finger, initial encounter for closed fracture: Secondary | ICD-10-CM | POA: Diagnosis not present

## 2020-05-20 DIAGNOSIS — S62652B Nondisplaced fracture of medial phalanx of right middle finger, initial encounter for open fracture: Secondary | ICD-10-CM | POA: Diagnosis not present
